# Patient Record
Sex: Male | Born: 1995 | Race: White | Hispanic: No | Marital: Single | Smoking: Never smoker
Health system: Southern US, Community
[De-identification: ages and names within clinical notes are randomized; demographics above are authoritative.]

## PROBLEM LIST (undated history)

## (undated) DIAGNOSIS — S060X9A Concussion with loss of consciousness of unspecified duration, initial encounter: Secondary | ICD-10-CM

## (undated) DIAGNOSIS — S060XAA Concussion with loss of consciousness status unknown, initial encounter: Secondary | ICD-10-CM

## (undated) DIAGNOSIS — F909 Attention-deficit hyperactivity disorder, unspecified type: Secondary | ICD-10-CM

## (undated) HISTORY — DX: Concussion with loss of consciousness of unspecified duration, initial encounter: S06.0X9A

## (undated) HISTORY — DX: Attention-deficit hyperactivity disorder, unspecified type: F90.9

## (undated) HISTORY — PX: WISDOM TOOTH EXTRACTION: SHX21

## (undated) HISTORY — DX: Concussion with loss of consciousness status unknown, initial encounter: S06.0XAA

---
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
## 2014-02-12 ENCOUNTER — Encounter: Payer: Self-pay | Admitting: Internal Medicine

## 2014-02-12 ENCOUNTER — Ambulatory Visit (INDEPENDENT_AMBULATORY_CARE_PROVIDER_SITE_OTHER): Payer: BC Managed Care – PPO | Admitting: Internal Medicine

## 2014-02-12 VITALS — BP 110/70 | HR 59 | Temp 97.6°F | Resp 18 | Ht 70.25 in | Wt 150.0 lb

## 2014-02-12 DIAGNOSIS — L03116 Cellulitis of left lower limb: Secondary | ICD-10-CM

## 2014-02-12 NOTE — Progress Notes (Signed)
Pre visit review using our clinic review tool, if applicable. No additional management support is needed unless otherwise documented below in the visit note. 

## 2014-02-12 NOTE — Patient Instructions (Signed)
Keep the wound of the left foot clean and dry  Take your antibiotic as prescribed until ALL of it is gone, but stop if you develop a rash, swelling, or any side effects of the medication.  Contact our office as soon as possible if  there are side effects of the medication.

## 2014-02-12 NOTE — Progress Notes (Signed)
   Subjective:    Patient ID: Clayton Frank, male    DOB: 1995-05-31, 18 y.o.   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  He developed a secondary infection and has been on topical and oral antibiotics since October 12.  He has improved nicely over the past few days.  Past Medical History  Diagnosis Date  . ADHD (attention deficit hyperactivity disorder)     History   Social History  . Marital Status: Single    Spouse Name: N/A    Number of Children: N/A  . Years of Education: N/A   Occupational History  . Not on file.   Social History Main Topics  . Smoking status: Never Smoker   . Smokeless tobacco: Never Used  . Alcohol Use: No  . Drug Use: No  . Sexual Activity: Not on file   Other Topics Concern  . Not on file   Social History Narrative  . No narrative on file    History reviewed. No pertinent past surgical history.  Family History  Problem Relation Age of Onset  . Coronary artery disease      fhx    No Known Allergies  No current outpatient prescriptions on file prior to visit.   No current facility-administered medications on file prior to visit.    BP 110/70  Pulse 59  Temp(Src) 97.6 F (36.4 C) (Oral)  Resp 18  Ht 5' 10.25" (1.784 m)  Wt 150 lb (68.04 kg)  BMI 21.38 kg/m2  SpO2 98%      Review of Systems     Objective:   Physical Exam  Constitutional: He appears well-developed and well-nourished. No distress.  Skin:  A 6 cm laceration present involving the medial aspect of the left foot Minimal surrounding erythema and redness No exudate          Assessment & Plan:   Improving skin and soft tissue infection/laceration, left medial foot.  The patient will complete doxycycline.  Continue Bactroban ointment Local wound care discussed.  He will keep clean and dry He will report any clinical deterioration

## 2014-11-16 ENCOUNTER — Ambulatory Visit (INDEPENDENT_AMBULATORY_CARE_PROVIDER_SITE_OTHER): Payer: BLUE CROSS/BLUE SHIELD | Admitting: Internal Medicine

## 2014-11-16 ENCOUNTER — Encounter: Payer: Self-pay | Admitting: Internal Medicine

## 2014-11-16 VITALS — BP 126/70 | HR 89 | Temp 98.5°F | Resp 20 | Ht 70.25 in | Wt 154.0 lb

## 2014-11-16 DIAGNOSIS — K5900 Constipation, unspecified: Secondary | ICD-10-CM | POA: Diagnosis not present

## 2014-11-16 DIAGNOSIS — K625 Hemorrhage of anus and rectum: Secondary | ICD-10-CM

## 2014-11-16 NOTE — Patient Instructions (Signed)

## 2014-11-16 NOTE — Progress Notes (Signed)
Pre visit review using our clinic review tool, if applicable. No additional management support is needed unless otherwise documented below in the visit note. 

## 2014-11-16 NOTE — Progress Notes (Signed)
   Subjective:    Patient ID: Clayton Frank, male    DOB: Apr 29, 1996, 19 y.o.   MRN: [id number]  HPI  19 year old patient who presents with a chief complaint of periodic bright red rectal bleeding.  This has been going on for least 8 months.  Blood is described as bright red and on the tissue paper only.  He does also describe constipation. 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 ADHD (attention deficit hyperactivity disorder)     History   Social History  . Marital Status: Single    Spouse Name: N/A  . Number of Children: N/A  . Years of Education: N/A   Occupational History  . Not on file.   Social History Main Topics  . Smoking status: Never Smoker   . Smokeless tobacco: Never Used  . Alcohol Use: No  . Drug Use: No  . Sexual Activity: Not on file   Other Topics Concern  . Not on file   Social History Narrative    No past surgical history on file.  Family History  Problem Relation Age of Onset  . Coronary artery disease      fhx    No Known Allergies  No current outpatient prescriptions on file prior to visit.   No current facility-administered medications on file prior to visit.    BP 126/70 mmHg  Pulse 89  Temp(Src) 98.5 F (36.9 C) (Oral)  Resp 20  Ht 5' 10.25" (1.784 m)  Wt 154 lb (69.854 kg)  BMI 21.95 kg/m2  SpO2 98%     Review of Systems  Constitutional: Negative for fever, chills, appetite change and fatigue.  HENT: Negative for congestion, dental problem, ear pain, hearing loss, sore throat, tinnitus, trouble swallowing and voice change.   Eyes: Negative for pain, discharge and visual disturbance.  Respiratory: Negative for cough, chest tightness, wheezing and stridor.   Cardiovascular: Negative for chest pain, palpitations and leg swelling.  Gastrointestinal: Positive for constipation and blood in stool. Negative for nausea, vomiting, abdominal pain,  diarrhea and abdominal distention.  Genitourinary: Negative for urgency, hematuria, flank pain, discharge, difficulty urinating and genital sores.  Musculoskeletal: Negative for myalgias, back pain, joint swelling, arthralgias, gait problem and neck stiffness.  Skin: Negative for rash.  Neurological: Negative for dizziness, syncope, speech difficulty, weakness, numbness and headaches.  Hematological: Negative for adenopathy. Does not bruise/bleed easily.  Psychiatric/Behavioral: Negative for behavioral problems and dysphoric mood. The patient is not nervous/anxious.        Objective:   Physical Exam  Constitutional: He appears well-developed and well-nourished. He appears distressed.  Abdominal: Soft. Bowel sounds are normal. He exhibits no distension and no mass. There is no tenderness. There is no rebound and no guarding.  Genitourinary: Rectum normal. Guaiac negative stool.          Assessment & Plan:   Periodic bright red rectal bleeding Constipation Normal.  Clinical exam  We'll treat the constipation issues with more fiber, liberal fluid intake and short-term use of stool softeners.  Patient information dispensed  Will call if persists

## 2014-12-09 ENCOUNTER — Emergency Department (HOSPITAL_COMMUNITY)
Admission: EM | Admit: 2014-12-09 | Discharge: 2014-12-10 | Disposition: A | Discharge status: Home / Self Care | Payer: Commercial Managed Care - PPO | Attending: Emergency Medicine | Admitting: Emergency Medicine

## 2014-12-09 ENCOUNTER — Encounter (HOSPITAL_COMMUNITY): Payer: Self-pay

## 2014-12-09 DIAGNOSIS — R61 Generalized hyperhidrosis: Secondary | ICD-10-CM | POA: Diagnosis not present

## 2014-12-09 DIAGNOSIS — Y998 Other external cause status: Secondary | ICD-10-CM | POA: Diagnosis not present

## 2014-12-09 DIAGNOSIS — Z79899 Other long term (current) drug therapy: Secondary | ICD-10-CM | POA: Diagnosis not present

## 2014-12-09 DIAGNOSIS — T63441A Toxic effect of venom of bees, accidental (unintentional), initial encounter: Secondary | ICD-10-CM | POA: Insufficient documentation

## 2014-12-09 DIAGNOSIS — R070 Pain in throat: Secondary | ICD-10-CM | POA: Insufficient documentation

## 2014-12-09 DIAGNOSIS — Y9289 Other specified places as the place of occurrence of the external cause: Secondary | ICD-10-CM | POA: Diagnosis not present

## 2014-12-09 DIAGNOSIS — R55 Syncope and collapse: Secondary | ICD-10-CM

## 2014-12-09 DIAGNOSIS — Z8659 Personal history of other mental and behavioral disorders: Secondary | ICD-10-CM | POA: Insufficient documentation

## 2014-12-09 DIAGNOSIS — Y9389 Activity, other specified: Secondary | ICD-10-CM | POA: Insufficient documentation

## 2014-12-09 DIAGNOSIS — R531 Weakness: Secondary | ICD-10-CM | POA: Diagnosis present

## 2014-12-09 MED ORDER — EPINEPHRINE 0.3 MG/0.3ML IJ SOAJ: 0.3000 mg | Freq: Once | INTRAMUSCULAR | Status: DC

## 2014-12-09 MED ORDER — DIPHENHYDRAMINE HCL 25 MG PO TABS: 50.0000 mg | ORAL_TABLET | Freq: Three times a day (TID) | ORAL | Status: DC | PRN

## 2014-12-09 MED ORDER — SODIUM CHLORIDE 0.9 % IV SOLN
Freq: Once | INTRAVENOUS | Status: AC
  Administered 2014-12-09: 22:00:00 via INTRAVENOUS

## 2014-12-09 NOTE — ED Notes (Signed)
Pt ambulated to restroom. 

## 2014-12-09 NOTE — ED Provider Notes (Signed)
CSN: [id number]     Arrival date & time 12/09/14  2112 History   First MD Initiated Contact with Patient 12/09/14 2113     Chief Complaint  Patient presents with  . Insect Bite   HPI Patient presents to the emergency room for evaluation after a bee sting. The patient was outside when he had a yellow jacket sting to the left bicep area. Patient has no prior history of allergic reaction. He walked inside. He started to feel very weak, diaphoretic and he appeared pale. He also started have some discomfort in his throat. EMS arrived and gave the patient 50 mg of Benadryl. His blood pressure was low in the 60s and he was given epinephrine IM. Patient subsequently is feeling much better. He is not having any difficulty with his breathing. He has not noticed a rash. Past Medical History  Diagnosis Date  . ADHD (attention deficit hyperactivity disorder)    History reviewed. No pertinent past surgical history. Family History  Problem Relation Age of Onset  . Coronary artery disease      fhx   Social History  Substance Use Topics  . Smoking status: Never Smoker   . Smokeless tobacco: Never Used  . Alcohol Use: No    Review of Systems  All other systems reviewed and are negative.     Allergies  Review of patient's allergies indicates no known allergies.  Home Medications   Prior to Admission medications   Medication Sig Start Date End Date Taking? Authorizing Provider  diphenhydrAMINE (BENADRYL) 25 MG tablet Take 2 tablets (50 mg total) by mouth every 8 (eight) hours as needed for itching. 12/09/14   Dorie Rank, MD  EPINEPHrine (EPIPEN 2-PAK) 0.3 mg/0.3 mL IJ SOAJ injection Inject 0.3 mLs (0.3 mg total) into the muscle once. 12/09/14   Dorie Rank, MD  Multiple Vitamin (MULTIVITAMIN) tablet Take 1 tablet by mouth daily.    Historical Provider, MD   BP 123/60 mmHg  Pulse 95  Temp(Src) 97.3 F (36.3 C) (Oral)  Resp 22  Ht 5\' 11"  (1.803 m)  Wt 150 lb (68.04 kg)  BMI 20.93 kg/m2  SpO2  99% Physical Exam  Constitutional: He appears well-developed and well-nourished. No distress.  HENT:  Head: Normocephalic and atraumatic.  Right Ear: External ear normal.  Left Ear: External ear normal.  Eyes: Conjunctivae are normal. Right eye exhibits no discharge. Left eye exhibits no discharge. No scleral icterus.  Neck: Neck supple. No tracheal deviation present.  Cardiovascular: Normal rate, regular rhythm and intact distal pulses.   Pulmonary/Chest: Effort normal and breath sounds normal. No stridor. No respiratory distress. He has no wheezes. He has no rales.  Abdominal: Soft. Bowel sounds are normal. He exhibits no distension. There is no tenderness. There is no rebound and no guarding.  Musculoskeletal: He exhibits no edema or tenderness.  Neurological: He is alert. He has normal strength. No cranial nerve deficit (no facial droop, extraocular movements intact, no slurred speech) or sensory deficit. He exhibits normal muscle tone. He displays no seizure activity. Coordination normal.  Skin: Skin is warm and dry. No rash noted.  Psychiatric: He has a normal mood and affect.  Nursing note and vitals reviewed.   ED Course  Procedures (including critical care time) Labs Review Labs Reviewed - No data to display  Imaging Review No results found.   EKG Interpretation   Date/Time:  Wednesday December 09 2014 21:20:06 EDT Ventricular Rate:  84 PR Interval:  145 QRS Duration: 112  QT Interval:  374 QTC Calculation: 442 R Axis:   88 Text Interpretation:  Sinus rhythm Incomplete right bundle branch block No  significant change since last tracing Confirmed by Mosie Angus  MD-J, Shayma Pfefferle  ([id number]) on 12/09/2014 9:46:27 PM      MDM   Final diagnoses:  Bee sting, accidental or unintentional, initial encounter  Vasovagal episode    Patient was monitored in the emergency room. He did not have any return of any of his symptoms. Overall, I think he had a vasovagal episode after the bee sting  rather than a true anaphylactic reaction. Patient did not have any urticaria, he never had any itching. He did respond to epinephrine injection but this would also help him with a vasovagal reaction.  I will give the patient a prescription for an EpiPen to carry with him however until he can see an allergist to have allergy testing to determine whether or not he has a hymenoptera allergy.  Discussed this plan with patient as well as his parents.    Dorie Rank, MD 12/09/14 [phone]

## 2014-12-09 NOTE — Discharge Instructions (Signed)

## 2014-12-09 NOTE — ED Notes (Signed)
Pt here for yellow jacket sting to left bicep. No hx of allergic rxn to bee stings. No hives or rash noted. Pt had episode of SOB, weakness, diaphoresis, and bp dropped to 60 systolic. Family gave pt 50 mg benadryl and medic on scene gave epi IM. On arrival to ED. NAD noted. Pt calm. Oriented

## 2016-04-18 ENCOUNTER — Encounter: Payer: Self-pay | Admitting: Allergy and Immunology

## 2016-04-18 ENCOUNTER — Ambulatory Visit (INDEPENDENT_AMBULATORY_CARE_PROVIDER_SITE_OTHER): Payer: Commercial Managed Care - PPO | Admitting: Allergy and Immunology

## 2016-04-18 ENCOUNTER — Encounter (INDEPENDENT_AMBULATORY_CARE_PROVIDER_SITE_OTHER): Payer: Self-pay

## 2016-04-18 VITALS — BP 116/68 | HR 88 | Resp 20 | Ht 70.0 in | Wt 164.6 lb

## 2016-04-18 DIAGNOSIS — J3089 Other allergic rhinitis: Secondary | ICD-10-CM

## 2016-04-18 DIAGNOSIS — Z91038 Other insect allergy status: Secondary | ICD-10-CM | POA: Diagnosis not present

## 2016-04-18 DIAGNOSIS — Z91030 Bee allergy status: Secondary | ICD-10-CM

## 2016-04-18 MED ORDER — EPINEPHRINE 0.3 MG/0.3ML IJ SOAJ: 0.3000 mg | Freq: Once | INTRAMUSCULAR | 1 refills | Status: AC

## 2016-04-18 NOTE — Patient Instructions (Addendum)
  1. Auvi Q 3.0, Benadryl, MD/ER evaluation for allergic reaction  2. Blood - hymenoptera panel  3. Venom clinic?  4. Immunotherapy?  5. Can use OTC Rhinocort one spray each nostril 3-7 times per week. Takes about one week to start working. Can also use OTC antihistamine - Claritin/Allegra/Zyrtec if needed  6. Obtain fall flu vaccine every year

## 2016-04-18 NOTE — Progress Notes (Signed)
Dear Dr. Burnice Logan,  Thank you for referring Clayton Frank to the [facility] on 04/18/2016.   Below is a summation of this patient's evaluation and recommendations.  Thank you for your referral. I will keep you informed about this patient's response to treatment.   If you have any questions please do not hesitate to contact me.   Sincerely,  Jiles Prows, MD [place]   ______________________________________________________________________    NEW PATIENT NOTE  Referring Provider: Marletta Lor, MD Primary Provider: Nyoka Cowden, MD Date of office visit: 04/18/2016    Subjective:   Chief Complaint:  Clayton Frank (DOB: 08-15-1995) is a 20 y.o. male who presents to the clinic on 04/18/2016 with a chief complaint of New Patient (Initial Visit); Allergy Testing; and Insect Bite .     HPI: [name] presents to this clinic in evaluation of an allergic reaction that occurred summer of 2016. Apparently he was stung by a yellow jacket on his left bicep and within 15 minutes started to develop redness around his neck and puffiness in his throat and foggy brain and weakness and blurred vision and sweating and had to sit down and almost passed out. In addition, he felt as though he had to have a bowel movement. Apparently EMT service documented a depressed blood pressure around 60 systolic or so and he was administered epinephrine. He was better within minutes and his reaction resolved within one hour. He has not been stung since that point in time.  As well, he has an issue with stuffy nose especially during the spring and summer season while locating at [employer]. Otherwise, the rest of the year he does quite well. He sometimes gets so stuffy he can't smell very well. There is no obvious provoking factor giving rise to this issue but it does appear to occur on  campus during the spring and summer and nowhere else.  Past Medical History:  Diagnosis Date  . ADHD (attention deficit hyperactivity disorder)   . Concussion     History reviewed. No pertinent surgical history.  Allergies as of 04/18/2016   No Known Allergies     Medication List      diphenhydrAMINE 25 MG tablet Commonly known as:  BENADRYL Take 2 tablets (50 mg total) by mouth every 8 (eight) hours as needed for itching.   EPINEPHrine 0.3 mg/0.3 mL Soaj injection Commonly known as:  EPIPEN 2-PAK Inject 0.3 mLs (0.3 mg total) into the muscle once.   multivitamin tablet Take 1 tablet by mouth daily.       Review of systems negative except as noted in HPI / PMHx or noted below:  Review of Systems  Constitutional: Negative.   HENT: Negative.   Eyes: Negative.   Respiratory: Negative.   Cardiovascular: Negative.   Gastrointestinal: Negative.   Genitourinary: Negative.   Musculoskeletal: Negative.   Skin: Negative.   Neurological: Negative.   Endo/Heme/Allergies: Negative.   Psychiatric/Behavioral: Negative.     Family History  Problem Relation Age of Onset  . Coronary artery disease      fhx  . Allergic rhinitis Father   . Hypertension Father   . Hyperlipidemia Father     Social History   Social History  . Marital status: Single    Spouse name: N/A  . Number of children: N/A  . Years of education: N/A   Occupational History  .  Not on file.   Social History Main Topics  . Smoking status: Never Smoker  . Smokeless tobacco: Never Used  . Alcohol use No  . Drug use: No  . Sexual activity: Not on file   Other Topics Concern  . Not on file   Social History Narrative  . No narrative on file    Environmental and Social history  Lives in a house with a dry environment, a cat and dog located inside the household, hardwood in the bedroom, no plastic on the bed or pillow, and no smokers located inside the household. He is a Ship broker at [employer].  Objective:   Vitals:   04/18/16 0939  BP: 116/68  Pulse: 88  Resp: 20   Height: 5\' 10"  (177.8 cm) Weight: 164 lb 9.6 oz (74.7 kg)  Physical Exam  Constitutional: He is well-developed, well-nourished, and in no distress.  HENT:  Head: Normocephalic. Head is without right periorbital erythema and without left periorbital erythema.  Right Ear: Tympanic membrane, external ear and ear canal normal.  Left Ear: Tympanic membrane, external ear and ear canal normal.  Nose: Nose normal. No mucosal edema or rhinorrhea.  Mouth/Throat: Oropharynx is clear and moist and mucous membranes are normal. No oropharyngeal exudate.  Eyes: Conjunctivae and lids are normal. Pupils are equal, round, and reactive to light.  Neck: Trachea normal. No tracheal deviation present. No thyromegaly present.  Cardiovascular: Normal rate, regular rhythm, S1 normal, S2 normal and normal heart sounds.   No murmur heard. Pulmonary/Chest: Effort normal. No stridor. No tachypnea. No respiratory distress. He has no wheezes. He has no rales. He exhibits no tenderness.  Abdominal: Soft. He exhibits no distension and no mass. There is no hepatosplenomegaly. There is no tenderness. There is no rebound and no guarding.  Musculoskeletal: He exhibits no edema or tenderness.  Lymphadenopathy:       Head (right side): No tonsillar adenopathy present.       Head (left side): No tonsillar adenopathy present.    He has no cervical adenopathy.    He has no axillary adenopathy.  Neurological: He is alert. Gait normal.  Skin: No rash noted. He is not diaphoretic. No erythema. No pallor. Nails show no clubbing.  Psychiatric: Mood and affect normal.    Diagnostics: Allergy skin tests were not performed.   Assessment and Plan:    1. Hymenoptera allergy   2. Other allergic rhinitis     1. Auvi Q 3.0, Benadryl, MD/ER evaluation for allergic reaction  2. Blood - hymenoptera panel  3. Venom clinic?  4.  Immunotherapy?  5. Can use OTC Rhinocort one spray each nostril 3-7 times per week. Takes about one week to start working. Can also use OTC antihistamine - Claritin/Allegra/Zyrtec if needed  6. Obtain fall flu vaccine every year  [name] has a history very consistent with Hymenoptera allergy and we will work through this issue in a little more detail by checking IgE specific antibodies directed against Hymenoptera and if we do not get enough information from that test we'll evaluate him in our upcoming venom clinic. If he does demonstrate hypersensitivity to venom then I would recommend that he start a course of immunotherapy given the fact that it does sound as though he developed hypotension with his reaction. In addition, he can use over-the-counter Rhinocort to treat his apparent aero allergen allergy affecting his upper airways during the spring and summer. Certainly if this is ineffective in alleviating his symptoms then he  will require further evaluation for that issue. I will contact him with the results of his blood tests once they are available for review.  Jiles Prows, MD [facility]

## 2016-04-24 LAB — ALLERGEN STINGING INSECT PANEL
Honeybee IgE: <0.1 kU/L
Hornet, White Face, IgE: 0.17 kU/L — AB
Hornet, Yellow, IgE: <0.1 kU/L
I003-IGE YELLOW JACKET: 0.73 kU/L — AB
Paper Wasp IgE: <0.1 kU/L

## 2016-04-28 ENCOUNTER — Telehealth: Payer: Self-pay

## 2016-04-28 NOTE — Telephone Encounter (Signed)
Pt called regarding lab results. He was advised about IT for mixed vespids. Pt states that he will think about getting the Injections. He did not want provide an answer right away.

## 2016-07-24 DIAGNOSIS — D485 Neoplasm of uncertain behavior of skin: Secondary | ICD-10-CM | POA: Diagnosis not present

## 2016-08-09 DIAGNOSIS — D225 Melanocytic nevi of trunk: Secondary | ICD-10-CM | POA: Diagnosis not present

## 2016-08-09 DIAGNOSIS — D485 Neoplasm of uncertain behavior of skin: Secondary | ICD-10-CM | POA: Diagnosis not present

## 2017-01-12 ENCOUNTER — Ambulatory Visit (INDEPENDENT_AMBULATORY_CARE_PROVIDER_SITE_OTHER): Payer: Commercial Managed Care - PPO | Admitting: Internal Medicine

## 2017-01-12 ENCOUNTER — Encounter: Payer: Self-pay | Admitting: Internal Medicine

## 2017-01-12 VITALS — BP 126/72 | HR 77 | Temp 98.1°F | Ht 70.5 in | Wt 181.6 lb

## 2017-01-12 DIAGNOSIS — Z0000 Encounter for general adult medical examination without abnormal findings: Secondary | ICD-10-CM

## 2017-01-12 NOTE — Patient Instructions (Signed)
Please return as needed for any acute problems

## 2017-01-12 NOTE — Progress Notes (Signed)
Subjective:    Patient ID: Clayton Frank, male    DOB: 09-28-1995, 21 y.o.   MRN: [id number]  HPI  21 year old patient who is seen today for form completion.  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  No history of syncope.  No orthopedic issues or concerns or complaints.  Past Medical History:  Diagnosis Date  . ADHD (attention deficit hyperactivity disorder)   . Concussion      Social History   Social History  . Marital status: Single    Spouse name: N/A  . Number of children: N/A  . Years of education: N/A   Occupational History  . Not on file.   Social History Main Topics  . Smoking status: Never Smoker  . Smokeless tobacco: Never Used  . Alcohol use No  . Drug use: No  . Sexual activity: Not on file   Other Topics Concern  . Not on file   Social History Narrative  . No narrative on file    No past surgical history on file.  Family History  Problem Relation Age of Onset  . Coronary artery disease Unknown        fhx  . Allergic rhinitis Father   . Hypertension Father   . Hyperlipidemia Father     No Known Allergies  No current outpatient prescriptions on file prior to visit.   No current facility-administered medications on file prior to visit.     BP 126/72 (BP Location: Left Arm, Patient Position: Sitting, Cuff Size: Normal)   Pulse 77   Temp 98.1 F (36.7 C) (Oral)   Ht 5' 10.5" (1.791 m)   Wt 181 lb 9.6 oz (82.4 kg)   SpO2 98%   BMI 25.69 kg/m     Review of Systems  Constitutional: Negative for appetite change, chills, fatigue and fever.  HENT: Negative for congestion, dental problem, ear pain, hearing loss, sore throat, tinnitus, trouble swallowing and voice change.   Eyes: Negative for pain, discharge and visual disturbance.  Respiratory: Negative for cough, chest tightness, wheezing and stridor.     Cardiovascular: Negative for chest pain, palpitations and leg swelling.  Gastrointestinal: Negative for abdominal distention, abdominal pain, blood in stool, constipation, diarrhea, nausea and vomiting.  Genitourinary: Negative for difficulty urinating, discharge, flank pain, genital sores, hematuria and urgency.  Musculoskeletal: Negative for arthralgias, back pain, gait problem, joint swelling, myalgias and neck stiffness.  Skin: Negative for rash.  Neurological: Negative for dizziness, syncope, speech difficulty, weakness, numbness and headaches.  Hematological: Negative for adenopathy. Does not bruise/bleed easily.  Psychiatric/Behavioral: Negative for behavioral problems and dysphoric mood. The patient is not nervous/anxious.        Objective:   Physical Exam  Constitutional: He is oriented to person, place, and time. He appears well-developed.  HENT:  Head: Normocephalic.  Right Ear: External ear normal.  Left Ear: External ear normal.  Eyes: Conjunctivae and EOM are normal.  Neck: Normal range of motion.  Cardiovascular: Normal rate and normal heart sounds.   No murmur heard. No murmur  Pulmonary/Chest: Breath sounds normal.  Abdominal: Bowel sounds are normal.  Musculoskeletal: Normal range of motion. He exhibits no edema or tenderness.  Neurological: He is alert and oriented to person, place, and time.  Psychiatric: He has a normal mood and affect. His behavior is normal.  Assessment & Plan:   Unremarkable exam No limitations Forms completed.  Excellent candidate for the police academy  Nyoka Cowden

## 2017-01-28 DIAGNOSIS — S90861A Insect bite (nonvenomous), right foot, initial encounter: Secondary | ICD-10-CM | POA: Diagnosis not present

## 2017-01-28 DIAGNOSIS — Z880 Allergy status to penicillin: Secondary | ICD-10-CM | POA: Diagnosis not present

## 2017-01-28 DIAGNOSIS — T63441A Toxic effect of venom of bees, accidental (unintentional), initial encounter: Secondary | ICD-10-CM | POA: Diagnosis not present

## 2017-02-06 ENCOUNTER — Telehealth: Payer: Self-pay | Admitting: *Deleted

## 2017-02-06 NOTE — Telephone Encounter (Signed)
Done and ready for pick up

## 2017-02-06 NOTE — Telephone Encounter (Signed)
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 Form is in Dr Burnice Logan folder.

## 2017-02-06 NOTE — Telephone Encounter (Signed)
Please note and advise

## 2017-02-07 NOTE — Telephone Encounter (Signed)
Patient picked up form dropped off on 02/06/17

## 2017-02-07 NOTE — Telephone Encounter (Signed)
Left a voicemail stating that paperwork is ready to be picked up.

## 2017-02-20 ENCOUNTER — Ambulatory Visit
Admission: RE | Admit: 2017-02-20 | Discharge: 2017-02-20 | Disposition: A | Discharge status: Home / Self Care | Payer: No Typology Code available for payment source | Source: Ambulatory Visit | Attending: Occupational Medicine | Admitting: Occupational Medicine

## 2017-02-20 ENCOUNTER — Other Ambulatory Visit: Payer: Self-pay | Admitting: Occupational Medicine

## 2017-02-20 DIAGNOSIS — Z021 Encounter for pre-employment examination: Secondary | ICD-10-CM

## 2017-06-05 ENCOUNTER — Ambulatory Visit: Payer: Commercial Managed Care - PPO | Admitting: Adult Health

## 2017-06-05 ENCOUNTER — Encounter: Payer: Self-pay | Admitting: Adult Health

## 2017-06-05 VITALS — BP 106/60 | Temp 98.3°F | Wt 178.0 lb

## 2017-06-05 DIAGNOSIS — J069 Acute upper respiratory infection, unspecified: Secondary | ICD-10-CM | POA: Diagnosis not present

## 2017-06-05 MED ORDER — DOXYCYCLINE HYCLATE 100 MG PO CAPS: 100.0000 mg | ORAL_CAPSULE | Freq: Two times a day (BID) | ORAL | 0 refills | Status: DC

## 2017-06-05 MED ORDER — FLUTICASONE PROPIONATE 50 MCG/ACT NA SUSP: 2.0000 | Freq: Every day | NASAL | 6 refills | Status: DC

## 2017-06-05 NOTE — Progress Notes (Signed)
Subjective:    Patient ID: Clayton Frank, male    DOB: July 16, 1995, 22 y.o.   MRN: [id number]  HPI  3 week history of nasal and chest congestion.  Also has a productive cough with green phlegm.  He denies fever, chest tightness.  Does have some shortness of breath when he is running (3 miles a day).  Using OTC robitussin with some relief for the past 3-4 days.       Review of Systems  Constitutional: Positive for chills and fatigue. Negative for appetite change, diaphoresis and fever.  HENT: Positive for congestion, sinus pressure and sinus pain. Negative for ear pain and sore throat.   Respiratory: Positive for cough, shortness of breath and wheezing. Negative for chest tightness.   Gastrointestinal: Negative.   Neurological: Positive for headaches.   Past Medical History:  Diagnosis Date  . ADHD (attention deficit hyperactivity disorder)   . Concussion     Social History   Socioeconomic History  . Marital status: Single    Spouse name: Not on file  . Number of children: Not on file  . Years of education: Not on file  . Highest education level: Not on file  Social Needs  . Financial resource strain: Not on file  . Food insecurity - worry: Not on file  . Food insecurity - inability: Not on file  . Transportation needs - medical: Not on file  . Transportation needs - non-medical: Not on file  Occupational History  . Not on file  Tobacco Use  . Smoking status: Never Smoker  . Smokeless tobacco: Never Used  Substance and Sexual Activity  . Alcohol use: No  . Drug use: No  . Sexual activity: Not on file  Other Topics Concern  . Not on file  Social History Narrative  . Not on file    History reviewed. No pertinent surgical history.  Family History  Problem Relation Age of Onset  . Coronary artery disease Unknown        fhx  . Allergic rhinitis Father   . Hypertension Father   . Hyperlipidemia Father     No Known Allergies  No current outpatient  medications on file prior to visit.   No current facility-administered medications on file prior to visit.     BP 106/60 (BP Location: Left Arm)   Temp 98.3 F (36.8 C) (Oral)   Wt 178 lb (80.7 kg)   BMI 25.18 kg/m       Objective:   Physical Exam  Constitutional: He appears well-developed and well-nourished.  HENT:  Head: Normocephalic.  Right Ear: Tympanic membrane normal.  Left Ear: Tympanic membrane normal.  Nose: Mucosal edema present. Right sinus exhibits no maxillary sinus tenderness and no frontal sinus tenderness. Left sinus exhibits no maxillary sinus tenderness and no frontal sinus tenderness.  Mouth/Throat: Uvula is midline. No uvula swelling. Posterior oropharyngeal edema and posterior oropharyngeal erythema present.  Redness bilateral inferior turbinates left greater than right.   Cardiovascular: Normal rate, regular rhythm and normal heart sounds.  Pulmonary/Chest: Effort normal and breath sounds normal.  Lymphadenopathy:    He has no cervical adenopathy.  Skin: Skin is warm and dry.       Assessment & Plan:  1. Upper respiratory infection, acute Continue OTC Robitussin for cough.  - doxycycline (VIBRAMYCIN) 100 MG capsule; Take 1 capsule (100 mg total) by mouth 2 (two) times daily.  Dispense: 14 capsule; Refill: 0 - fluticasone (FLONASE) 50 MCG/ACT nasal spray; Place  2 sprays into both nostrils daily.  Dispense: 16 g; Refill: 6

## 2017-06-05 NOTE — Progress Notes (Signed)
Subjective:    Patient ID: Clayton Frank, male    DOB: 03-30-1996, 22 y.o.   MRN: [id number]  URI   This is a new problem. The current episode started 1 to 4 weeks ago (3 weeks ). The problem has been unchanged. There has been no fever. Associated symptoms include congestion, coughing, sinus pain and a sore throat. Pertinent negatives include no ear pain, nausea, plugged ear sensation, rhinorrhea or swollen glands. Treatments tried: Robatussin  The treatment provided no relief.     Review of Systems  Constitutional: Negative.   HENT: Positive for congestion, sinus pain and sore throat. Negative for ear pain, postnasal drip, rhinorrhea and trouble swallowing.   Respiratory: Positive for cough and shortness of breath (with exertion ).   Cardiovascular: Negative.   Gastrointestinal: Negative for nausea.   Past Medical History:  Diagnosis Date  . ADHD (attention deficit hyperactivity disorder)   . Concussion     Social History   Socioeconomic History  . Marital status: Single    Spouse name: Not on file  . Number of children: Not on file  . Years of education: Not on file  . Highest education level: Not on file  Social Needs  . Financial resource strain: Not on file  . Food insecurity - worry: Not on file  . Food insecurity - inability: Not on file  . Transportation needs - medical: Not on file  . Transportation needs - non-medical: Not on file  Occupational History  . Not on file  Tobacco Use  . Smoking status: Never Smoker  . Smokeless tobacco: Never Used  Substance and Sexual Activity  . Alcohol use: No  . Drug use: No  . Sexual activity: Not on file  Other Topics Concern  . Not on file  Social History Narrative  . Not on file    History reviewed. No pertinent surgical history.  Family History  Problem Relation Age of Onset  . Coronary artery disease Unknown        fhx  . Allergic rhinitis Father   . Hypertension Father   . Hyperlipidemia Father      No Known Allergies  No current outpatient medications on file prior to visit.   No current facility-administered medications on file prior to visit.     BP 106/60 (BP Location: Left Arm)   Temp 98.3 F (36.8 C) (Oral)   Wt 178 lb (80.7 kg)   BMI 25.18 kg/m       Objective:   Physical Exam  Constitutional: He is oriented to person, place, and time. He appears well-developed and well-nourished. No distress.  HENT:  Head: Normocephalic and atraumatic.  Right Ear: External ear normal.  Left Ear: External ear normal.  Nose: Mucosal edema and rhinorrhea present. Right sinus exhibits maxillary sinus tenderness and frontal sinus tenderness. Left sinus exhibits maxillary sinus tenderness and frontal sinus tenderness.  Mouth/Throat: Uvula is midline, oropharynx is clear and moist and mucous membranes are normal.  Neck: Normal range of motion. Neck supple.  Cardiovascular: Normal rate, regular rhythm, normal heart sounds and intact distal pulses. Exam reveals no gallop and no friction rub.  No murmur heard. Pulmonary/Chest: Effort normal and breath sounds normal. No respiratory distress. He has no wheezes. He has no rales. He exhibits no tenderness.  Neurological: He is alert and oriented to person, place, and time.  Skin: Skin is warm and dry. No rash noted. He is not diaphoretic. No erythema. No pallor.  Psychiatric: He  has a normal mood and affect. His behavior is normal. Judgment and thought content normal.  Nursing note and vitals reviewed.     Assessment & Plan:  1. Upper respiratory infection, acute - Will treat due to symptoms. Advised rest and hydration.  - Can continue with Robitussin for cough  - doxycycline (VIBRAMYCIN) 100 MG capsule; Take 1 capsule (100 mg total) by mouth 2 (two) times daily.  Dispense: 14 capsule; Refill: 0 - fluticasone (FLONASE) 50 MCG/ACT nasal spray; Place 2 sprays into both nostrils daily.  Dispense: 16 g; Refill: 6 - Follow up if no  improvement in the next 2-3 days   Dorothyann Peng, NP

## 2017-12-28 ENCOUNTER — Encounter: Payer: Self-pay | Admitting: Family Medicine

## 2017-12-28 ENCOUNTER — Ambulatory Visit: Payer: 59 | Admitting: Family Medicine

## 2017-12-28 DIAGNOSIS — Z0289 Encounter for other administrative examinations: Secondary | ICD-10-CM

## 2017-12-28 NOTE — Progress Notes (Deleted)
  Clayton Frank [facility] DOB: 29-Mar-1996 Encounter date: 12/28/2017  This is a 22 y.o. male who presents with No chief complaint on file.   History of present illness:  HPI   No Known Allergies No outpatient medications have been marked as taking for the 12/28/17 encounter (Appointment) with Caren Macadam, MD.    Review of Systems  Objective:  There were no vitals taken for this visit.      BP Readings from Last 3 Encounters:  06/05/17 106/60  01/12/17 126/72  04/18/16 116/68   Wt Readings from Last 3 Encounters:  06/05/17 178 lb (80.7 kg)  01/12/17 181 lb 9.6 oz (82.4 kg)  04/18/16 164 lb 9.6 oz (74.7 kg)    Physical Exam  Assessment/Plan  There are no diagnoses linked to this encounter.       Micheline Rough, MD

## 2018-01-07 ENCOUNTER — Encounter: Payer: Self-pay | Admitting: Internal Medicine

## 2018-01-07 ENCOUNTER — Ambulatory Visit: Payer: 59 | Admitting: Internal Medicine

## 2018-01-07 VITALS — BP 100/60 | HR 98 | Temp 98.0°F | Wt 160.8 lb

## 2018-01-07 DIAGNOSIS — R1011 Right upper quadrant pain: Secondary | ICD-10-CM

## 2018-01-07 MED ORDER — EPINEPHRINE 0.3 MG/0.3ML IJ SOAJ: 0.3000 mg | Freq: Once | INTRAMUSCULAR | 2 refills | Status: AC

## 2018-01-07 MED ORDER — PANTOPRAZOLE SODIUM 40 MG PO TBEC: 40.0000 mg | DELAYED_RELEASE_TABLET | Freq: Every day | ORAL | 3 refills | Status: DC

## 2018-01-07 NOTE — Progress Notes (Signed)
Subjective:    Patient ID: Clayton Frank, male    DOB: [birth year], 22 y.o.   MRN: [id number]  HPI  22 year old patient who presents with a chief complaint of abdominal pain.  He describes a 8 to 2-month history of right upper quadrant discomfort.  He describes this as a dull ache.  More recently it has also involve the epigastric area.  He states the pain is fairly mild 1-2/10 initially but now is occasionally 3-4/10.  No associated nausea. He states pain is aggravated by alcohol use.  He also states that occasionally discomfort is postprandial and occurs 10 to 15 minutes following meals.  He states the pain is bothersome daily to every other day and last 30 to 60 minutes No family history of cholelithiasis  Social history he has recently graduated from the [employer] academy and has recently started employment as a Engineer, structural  Past Medical History:  Diagnosis Date  . ADHD (attention deficit hyperactivity disorder)   . Concussion      Social History   Socioeconomic History  . Marital status: Single    Spouse name: Not on file  . Number of children: Not on file  . Years of education: Not on file  . Highest education level: Not on file  Occupational History  . Not on file  Social Needs  . Financial resource strain: Not on file  . Food insecurity:    Worry: Not on file    Inability: Not on file  . Transportation needs:    Medical: Not on file    Non-medical: Not on file  Tobacco Use  . Smoking status: Never Smoker  . Smokeless tobacco: Never Used  Substance and Sexual Activity  . Alcohol use: No  . Drug use: No  . Sexual activity: Not on file  Lifestyle  . Physical activity:    Days per week: Not on file    Minutes per session: Not on file  . Stress: Not on file  Relationships  . Social connections:    Talks on phone: Not on file    Gets together: Not on file    Attends religious service: Not on file    Active member of club or organization: Not on file   Attends meetings of clubs or organizations: Not on file    Relationship status: Not on file  . Intimate partner violence:    Fear of current or ex partner: Not on file    Emotionally abused: Not on file    Physically abused: Not on file    Forced sexual activity: Not on file  Other Topics Concern  . Not on file  Social History Narrative  . Not on file    History reviewed. No pertinent surgical history.  Family History  Problem Relation Age of Onset  . Coronary artery disease Unknown        fhx  . Allergic rhinitis Father   . Hypertension Father   . Hyperlipidemia Father     No Known Allergies  No current outpatient medications on file prior to visit.   No current facility-administered medications on file prior to visit.     BP 100/60 (BP Location: Right Arm, Patient Position: Sitting, Cuff Size: Normal)   Pulse 98   Temp 98 F (36.7 C) (Oral)   Wt 160 lb 12.8 oz (72.9 kg)   SpO2 96%   BMI 22.75 kg/m     Review of Systems  Constitutional: Negative for appetite change, chills,  fatigue and fever.  HENT: Negative for congestion, dental problem, ear pain, hearing loss, sore throat, tinnitus, trouble swallowing and voice change.   Eyes: Negative for pain, discharge and visual disturbance.  Respiratory: Negative for cough, chest tightness, wheezing and stridor.   Cardiovascular: Negative for chest pain, palpitations and leg swelling.  Gastrointestinal: Positive for abdominal pain. Negative for abdominal distention, blood in stool, constipation, diarrhea, nausea and vomiting.  Genitourinary: Negative for difficulty urinating, discharge, flank pain, genital sores, hematuria and urgency.  Musculoskeletal: Negative for arthralgias, back pain, gait problem, joint swelling, myalgias and neck stiffness.  Skin: Negative for rash.  Neurological: Negative for dizziness, syncope, speech difficulty, weakness, numbness and headaches.  Hematological: Negative for adenopathy. Does not  bruise/bleed easily.  Psychiatric/Behavioral: Negative for behavioral problems and dysphoric mood. The patient is not nervous/anxious.        Objective:   Physical Exam  Constitutional: He is oriented to person, place, and time. He appears well-developed. No distress.  HENT:  Head: Normocephalic.  Right Ear: External ear normal.  Left Ear: External ear normal.  Eyes: Conjunctivae and EOM are normal.  Neck: Normal range of motion.  Cardiovascular: Normal rate and normal heart sounds.  Pulmonary/Chest: Breath sounds normal.  Abdominal: Soft. Bowel sounds are normal. He exhibits no distension and no mass. There is no tenderness. There is no guarding.  Musculoskeletal: Normal range of motion. He exhibits no edema or tenderness.  Neurological: He is alert and oriented to person, place, and time.  Psychiatric: He has a normal mood and affect. His behavior is normal.          Assessment & Plan:   Chronic abdominal pain.  Clinical exam unremarkable.  Will check gallbladder ultrasound.  Will empirically treat with 3 to 4 weeks of PPI therapy.  Will call if unimproved  Marletta Lor

## 2018-01-07 NOTE — Patient Instructions (Signed)
Avoids foods high in acid such as tomatoes citrus juices, and spicy foods.  Avoid eating within two hours of lying down or before exercising.  Do not overheat.  Try smaller more frequent meals.   Protonix 1 tablet daily for 30 days  Gallbladder ultrasound  Call or return to clinic prn if these symptoms worsen or fail to improve as anticipated.

## 2018-01-21 ENCOUNTER — Ambulatory Visit
Admission: RE | Admit: 2018-01-21 | Discharge: 2018-01-21 | Disposition: A | Discharge status: Home / Self Care | Payer: 59 | Source: Ambulatory Visit | Attending: Internal Medicine | Admitting: Internal Medicine

## 2018-01-21 DIAGNOSIS — R1011 Right upper quadrant pain: Secondary | ICD-10-CM

## 2018-01-31 ENCOUNTER — Telehealth: Payer: Self-pay | Admitting: Internal Medicine

## 2018-01-31 NOTE — Telephone Encounter (Signed)
Copied from [place] [id number]. Topic: Quick Communication - Other Results >> Jan 31, 2018 12:34 PM Cecelia Byars, NT wrote: [facility] AMB CRM OTHER [id number] Patient called and would like a return call with the results on his ultrasound on 01/21/18 please call him at   [phone]

## 2018-01-31 NOTE — Telephone Encounter (Signed)
Copied from [place] [id number]. Topic: Quick Communication - Other Results >> Jan 31, 2018 12:34 PM Cecelia Byars, NT wrote: Patient called and would like a return call with the results on his ultrasound on 01/21/18 please call him at   [phone]

## 2018-02-01 NOTE — Telephone Encounter (Signed)
Spoke to pt and informed him that there were no findings to calm some worries. Pt was also advise that  I would get a provider to go over ultrasound to verify. Pt will be on standby for results.

## 2018-02-04 NOTE — Telephone Encounter (Signed)
Can you please result pt ultrasound and advise  Thank you!

## 2018-02-04 NOTE — Telephone Encounter (Signed)
I reviewed the abdominal ultrasound.  The radiologist says everything is normal.  If he is still symptomatic he will need to see 1 of the new doctors for evaluation

## 2018-02-04 NOTE — Telephone Encounter (Signed)
Message routed to Dr.Todd.

## 2018-02-05 NOTE — Telephone Encounter (Signed)
Pt still having abdominal pain he stated around his appendix. Pt wants to be seen by a provider here. Pt scheduled to see Dr.Koberlien.

## 2018-02-08 NOTE — Telephone Encounter (Signed)
I was looking over schedule next week and saw Clayton Frank on our schedule for pain around appendix. When he saw Dr. Sherren Mocha pain was RUQ and not localized to RLQ where appendix would be. His [place] only looked at gallbladder area and not appendix. If his pain has changed and it is in lower abdomen then he needs to be seen today for evaluation.

## 2018-02-08 NOTE — Telephone Encounter (Signed)
Copied from [place] [id number]. Topic: Quick Communication - Office Called Patient (Clinic Use ONLY) >> Feb 08, 2018 10:30 AM Keene Breath wrote: Patient is returning call.  Patient stated that his pain has not moved and that he already has an appointment on  17th that he will keep and does not need to see the doctor sooner.  Please advised.

## 2018-02-08 NOTE — Telephone Encounter (Signed)
Left a detailed message on cell phone voice mail asking patient to call back and schedule an appointment today. CRM created.

## 2018-02-11 NOTE — Telephone Encounter (Signed)
Ok noted; just worried when I saw pain around appendix since that was different than other visits. Thanks for checking!

## 2018-02-13 NOTE — Progress Notes (Signed)
Clayton Frank [facility] DOB: May 14, 1995 Encounter date: 02/14/2018  This is a 22 y.o. male who presents with Chief Complaint  Patient presents with  . Abdominal Pain    seen Dr. Raliegh Ip for abdiminal pain on right side, pt states is job requires a lot of movement, pain is slightly worse, pain is now almost all the way across the lower abdomin, 4/10 pain scale, does not use OTC meds, protonix did not help    History of present illness:  Hasn't bothered him too much, but hasn't gone away either. Been present for 9-10 months now.   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   Tried acid reflux medication which didn't help; tried it for a month.   Patient states pain is in right lower quadrant and has been there this whole time.   If he eats a lot of food will feel it worse. Dull pain, maybe 3/10. If he drinks alcohol then he feels it much quicker. Does sometimes go away entirely.   Not sure about association with bowel movements. Not related to urine. No straining with BM; usually BID. No blood in the stools.   If working it feels worse. Just with being busy and continuous movement. Feels a little sharper at work. At worst was 5/10.   No nausea, no vomiting.     Allergies  Allergen Reactions  . Bee Venom Anaphylaxis   No outpatient medications have been marked as taking for the 02/14/18 encounter (Office Visit) with Caren Macadam, MD.    Review of Systems  Constitutional: Negative for activity change, appetite change, chills, fatigue and fever. Unexpected weight change: has been trying to gain weight. When doing running program lost weight but trying to build back up.  Respiratory: Negative for cough, chest tightness, shortness of breath and wheezing.   Cardiovascular: Negative for chest pain, palpitations and leg swelling.  Gastrointestinal: Positive for abdominal pain. Negative for abdominal distention, anal bleeding, blood in stool, constipation,  diarrhea, nausea and vomiting.  Genitourinary: Negative for difficulty urinating, dysuria, flank pain and frequency.  Musculoskeletal: Negative for back pain.    Objective:  BP 118/60 (BP Location: Left Arm, Patient Position: Sitting, Cuff Size: Normal)   Pulse (!) 52   Temp 98.1 F (36.7 C) (Oral)   Wt 169 lb (76.7 kg)   SpO2 100%   BMI 23.91 kg/m   Weight: 169 lb (76.7 kg)   BP Readings from Last 3 Encounters:  02/14/18 118/60  01/07/18 100/60  06/05/17 106/60   Wt Readings from Last 3 Encounters:  02/14/18 169 lb (76.7 kg)  01/07/18 160 lb 12.8 oz (72.9 kg)  06/05/17 178 lb (80.7 kg)    Physical Exam  Constitutional: He is oriented to person, place, and time. He appears well-developed and well-nourished. No distress.  Cardiovascular: Normal rate, regular rhythm and normal heart sounds. Exam reveals no friction rub.  No murmur heard. No lower extremity edema  Pulmonary/Chest: Effort normal and breath sounds normal. No respiratory distress. He has no wheezes. He has no rales.  Abdominal: Bowel sounds are normal. There is no hepatosplenomegaly. There is tenderness (mild RLQ). There is no rigidity, no rebound, no guarding, no CVA tenderness and negative Murphy's sign. McBurney's point: very mild tenderness. No hernia. Hernia confirmed negative in the right inguinal area and confirmed negative in the left inguinal area.  Neurological: He is alert and oriented to person, place, and time.  Psychiatric: His behavior is  normal. Cognition and memory are normal.    Assessment/Plan 1. Right lower quadrant pain Very vague, long-standing (9-10 months), and subtle. Does not get severe and is not limiting any activity. He has had some bloodwork done through work physical that I have asked him to get (record release signed) I think reasonable to get a basic panel if not already completed during time of symptoms. He is in favor of limiting studies with radiation (discussed CT abd/pelvis if  needed down road) but is in agreement for getting and [place] of abdomen for further exploration of the RLQ. We discussed limitations of this study but will proceed for initial evaluation. He does agree to go to ER if pain suddenly becomes severe. - US Abdomen Complete; Future    Return pending [place] results and lab results.     Micheline Rough, MD

## 2018-02-14 ENCOUNTER — Encounter: Payer: Self-pay | Admitting: Family Medicine

## 2018-02-14 ENCOUNTER — Ambulatory Visit: Payer: 59 | Admitting: Family Medicine

## 2018-02-14 VITALS — BP 118/60 | HR 52 | Temp 98.1°F | Wt 169.0 lb

## 2018-02-14 DIAGNOSIS — R1031 Right lower quadrant pain: Secondary | ICD-10-CM

## 2018-02-22 ENCOUNTER — Telehealth: Payer: Self-pay | Admitting: Internal Medicine

## 2018-02-22 ENCOUNTER — Other Ambulatory Visit: Payer: 59

## 2018-02-22 NOTE — Telephone Encounter (Signed)
Copied from [place] [id number]. Topic: Quick Communication - See Telephone Encounter >> Feb 22, 2018  1:22 PM Bea Graff, NT wrote: CRM for notification. See Telephone encounter for: 02/22/18. Pt spoke with [facility] and they stated he will have to have his US done at the hospital and to contact his PCP for scheduling as they do not do [place] of appendix or bowel. Please advise.

## 2018-02-25 NOTE — Telephone Encounter (Signed)
[phone] Pt would like to have a call as soon as possible to get this scheduled

## 2018-02-26 ENCOUNTER — Telehealth: Payer: Self-pay | Admitting: Family Medicine

## 2018-02-26 NOTE — Telephone Encounter (Signed)
Pt need a new order placed per toni the scheduler at the hospital sent the Dr a message to place a new order

## 2018-02-26 NOTE — Telephone Encounter (Signed)
Pt need a  new order placed for  [id number] - US ABDOMEN COMPLETE need to be done at the hospital . The schedulers can not schedule off the old order from r [facility]

## 2018-02-27 ENCOUNTER — Other Ambulatory Visit: Payer: Self-pay | Admitting: Family Medicine

## 2018-02-27 DIAGNOSIS — R1031 Right lower quadrant pain: Secondary | ICD-10-CM

## 2018-02-27 NOTE — Progress Notes (Signed)
us

## 2018-02-27 NOTE — Telephone Encounter (Signed)
I put in new order but just to clarify; what hospital is he wanting done at? I selected [facility] but if this is not correct let me know as I will need to put in another order

## 2018-02-27 NOTE — Telephone Encounter (Signed)
Spoke with patient, [facility] is the [facility] hospital.

## 2018-03-04 ENCOUNTER — Other Ambulatory Visit: Payer: 59

## 2018-03-04 ENCOUNTER — Other Ambulatory Visit: Payer: Self-pay | Admitting: Internal Medicine

## 2018-03-04 ENCOUNTER — Ambulatory Visit
Admission: RE | Admit: 2018-03-04 | Discharge: 2018-03-04 | Disposition: A | Discharge status: Home / Self Care | Payer: 59 | Source: Ambulatory Visit | Attending: Family Medicine | Admitting: Family Medicine

## 2018-03-04 DIAGNOSIS — R109 Unspecified abdominal pain: Secondary | ICD-10-CM

## 2018-03-04 DIAGNOSIS — R1011 Right upper quadrant pain: Secondary | ICD-10-CM | POA: Diagnosis not present

## 2018-03-04 DIAGNOSIS — H16223 Keratoconjunctivitis sicca, not specified as Sjogren's, bilateral: Secondary | ICD-10-CM | POA: Diagnosis not present

## 2018-03-04 MED ORDER — IOPAMIDOL (ISOVUE-300) INJECTION 61%
100.0000 mL | Freq: Once | INTRAVENOUS | Status: AC | PRN
  Administered 2018-03-04: 100 mL via INTRAVENOUS

## 2018-03-04 NOTE — Telephone Encounter (Signed)
Clayton Frank received a call from [facility] stating that the imaging order Dr. Ethlyn Gallery had place will only view he Gallbladder and it needs to be changed to a CT in order to see the appendix area and bowel area. I spoke with patient per Dr. Crecencio Mc request and informed him that [facility] imaging does not think the imaging ordered will be helpful and the order has to be changed to CT with Contrast. Patient stated that his appointment was in 45 minutes and that he would contact [facility] to see if this could be done today or if he needed to reschedule.  Patient is aware that Order has been changed.

## 2018-03-04 NOTE — Telephone Encounter (Signed)
Noted  

## 2018-03-11 DIAGNOSIS — D225 Melanocytic nevi of trunk: Secondary | ICD-10-CM | POA: Diagnosis not present

## 2018-03-11 DIAGNOSIS — D1801 Hemangioma of skin and subcutaneous tissue: Secondary | ICD-10-CM | POA: Diagnosis not present

## 2018-03-11 DIAGNOSIS — L648 Other androgenic alopecia: Secondary | ICD-10-CM | POA: Diagnosis not present

## 2018-03-20 DIAGNOSIS — Z23 Encounter for immunization: Secondary | ICD-10-CM | POA: Diagnosis not present

## 2018-10-18 ENCOUNTER — Ambulatory Visit (INDEPENDENT_AMBULATORY_CARE_PROVIDER_SITE_OTHER): Payer: 59 | Admitting: Internal Medicine

## 2018-10-18 DIAGNOSIS — R1031 Right lower quadrant pain: Secondary | ICD-10-CM | POA: Diagnosis not present

## 2018-10-18 NOTE — Progress Notes (Signed)
Virtual Visit via Video Note  I connected with Clayton Frank on 10/18/18 at 11:30 AM EDT by a video enabled telemedicine application and verified that I am speaking with the correct person using two identifiers.  Location patient: home Location provider: work office Persons participating in the virtual visit: patient, provider  I discussed the limitations of evaluation and management by telemedicine and the availability of in person appointments. The patient expressed understanding and agreed to proceed.   HPI: He has scheduled this visit to discuss continued RLQ abdominal pain. He has been seen twice in the office last year for this. Has been ongoing since about August 2019. He had a normal RUQ [place] in 9/19 and a normal CT abd/pelvis in 11/19. No lab work that I can see. He describes low intensity pain in his RLQ that is more prominent after a core workout. No hernias that he can discern, no n/v, no diarrhea, no constipation, no blood in stool, no appetite change, no fatigue. Has a soft solid BM once or twice a day. He is concerned as his MGF and PGM both had colon cancer. He is requesting a GI referral.   ROS: Constitutional: Denies fever, chills, diaphoresis, appetite change and fatigue.  HEENT: Denies photophobia, eye pain, redness, hearing loss, ear pain, congestion, sore throat, rhinorrhea, sneezing, mouth sores, trouble swallowing, neck pain, neck stiffness and tinnitus.   Respiratory: Denies SOB, DOE, cough, chest tightness,  and wheezing.   Cardiovascular: Denies chest pain, palpitations and leg swelling.  Gastrointestinal: Denies nausea, vomiting,  diarrhea, constipation, blood in stool and abdominal distention.  Genitourinary: Denies dysuria, urgency, frequency, hematuria, flank pain and difficulty urinating.  Endocrine: Denies: hot or cold intolerance, sweats, changes in hair or nails, polyuria, polydipsia. Musculoskeletal: Denies myalgias, back pain, joint swelling,  arthralgias and gait problem.  Skin: Denies pallor, rash and wound.  Neurological: Denies dizziness, seizures, syncope, weakness, light-headedness, numbness and headaches.  Hematological: Denies adenopathy. Easy bruising, personal or family bleeding history  Psychiatric/Behavioral: Denies suicidal ideation, mood changes, confusion, nervousness, sleep disturbance and agitation   Past Medical History:  Diagnosis Date  . ADHD (attention deficit hyperactivity disorder)   . Concussion     No past surgical history on file.  Family History  Problem Relation Age of Onset  . Coronary artery disease Unknown        fhx  . Allergic rhinitis Father   . Hypertension Father   . Hyperlipidemia Father     SOCIAL HX:   reports that he has never smoked. He has never used smokeless tobacco. He reports that he does not drink alcohol or use drugs.  No current outpatient medications on file.  EXAM:   VITALS per patient if applicable: none reported  GENERAL: alert, oriented, appears well and in no acute distress  HEENT: atraumatic, conjunttiva clear, no obvious abnormalities on inspection of external nose and ears, wears corrective lenses  NECK: normal movements of the head and neck  LUNGS: on inspection no signs of respiratory distress, breathing rate appears normal, no obvious gross increased work of breathing, gasping or wheezing  CV: no obvious cyanosis  MS: moves all visible extremities without noticeable abnormality  PSYCH/NEURO: pleasant and cooperative, no obvious depression or anxiety, speech and thought processing grossly intact  ASSESSMENT AND PLAN:   RLQ abdominal pain  -Etiology remains unclear; hernia remains a possibility that I am unable to discard as this is a virtual visit. -Normal [place] and CT is very reassuring. -  Check CBC, CMET, UA. -Refer to GI per patient request.    I discussed the assessment and treatment plan with the patient. The patient was provided an  opportunity to ask questions and all were answered. The patient agreed with the plan and demonstrated an understanding of the instructions.   The patient was advised to call back or seek an in-person evaluation if the symptoms worsen or if the condition fails to improve as anticipated.    Lelon Frohlich, MD  [facility]

## 2018-11-05 ENCOUNTER — Telehealth: Payer: Self-pay | Admitting: *Deleted

## 2018-11-05 DIAGNOSIS — R1011 Right upper quadrant pain: Secondary | ICD-10-CM

## 2018-11-05 DIAGNOSIS — R1031 Right lower quadrant pain: Secondary | ICD-10-CM

## 2018-11-05 NOTE — Telephone Encounter (Signed)
Copied from [employer] [id number]. Topic: Referral - Request for Referral >> Oct 17, 2018  2:42 PM Richardo Priest, [place] wrote: Has patient seen PCP for this complaint? Yes.  *If NO, is insurance requiring patient see PCP for this issue before PCP can refer them? Referral for which specialty: Gastroenterology  Preferred provider/office: Any Reason for referral: Abdominal pain becoming more severe. States he has had this issue for 2 years. >> Nov 04, 2018  4:11 PM Cox, Melburn Hake, CMA wrote: Per OV notes referral was to be placed. There is no GI referral in chart. Please advise.  >> Nov 04, 2018  3:46 PM Rutherford Nail, NT wrote: Patient calling to check status of getting referral. Patient completed TOC with Dr Jerilee Hoh and has not heard anything from the office regarding the referral to GI. Please advise.  >> Oct 17, 2018  2:52 PM Cox, Melburn Hake, CMA wrote: Pt being scheduled for [facility] in order to have referral.

## 2018-11-05 NOTE — Telephone Encounter (Signed)
Referral placed per office note.

## 2018-11-06 NOTE — Telephone Encounter (Signed)
Left message on machine for patient to call back to schedule lab appointment and to see if he has a GI appointment.

## 2018-11-06 NOTE — Telephone Encounter (Signed)
Spoke with patient and he has schedule an appointment with GI and lab.

## 2018-11-06 NOTE — Telephone Encounter (Signed)
Pt returned call regarding lab appt. No answer on FC line x4. Please return call. Pt does not have GI appt yet

## 2018-11-19 ENCOUNTER — Other Ambulatory Visit (INDEPENDENT_AMBULATORY_CARE_PROVIDER_SITE_OTHER): Payer: 59

## 2018-11-19 DIAGNOSIS — R1031 Right lower quadrant pain: Secondary | ICD-10-CM

## 2018-11-19 LAB — COMPREHENSIVE METABOLIC PANEL
ALT: 16 U/L (ref 0–53)
AST: 12 U/L (ref 0–37)
Albumin: 4.7 g/dL (ref 3.5–5.2)
Alkaline Phosphatase: 67 U/L (ref 39–117)
BUN: 13 mg/dL (ref 6–23)
CO2: 28 mEq/L (ref 19–32)
Calcium: 9.4 mg/dL (ref 8.4–10.5)
Chloride: 103 mEq/L (ref 96–112)
Creatinine, Ser: 1.07 mg/dL (ref 0.40–1.50)
GFR: 84.68 mL/min (ref >60.00)
Glucose, Bld: 100 mg/dL — ABNORMAL HIGH (ref 70–99)
Potassium: 3.7 mEq/L (ref 3.5–5.1)
Sodium: 140 mEq/L (ref 135–145)
Total Bilirubin: 0.7 mg/dL (ref 0.2–1.2)
Total Protein: 6.9 g/dL (ref 6.0–8.3)

## 2018-11-19 LAB — CBC WITH DIFFERENTIAL/PLATELET
Basophils Absolute: 0.1 10*3/uL (ref 0.0–0.1)
Basophils Relative: 0.6 % (ref 0.0–3.0)
Eosinophils Absolute: 0.1 10*3/uL (ref 0.0–0.7)
Eosinophils Relative: 0.5 % (ref 0.0–5.0)
HCT: 46.1 % (ref 39.0–52.0)
Hemoglobin: 15.9 g/dL (ref 13.0–17.0)
Lymphocytes Relative: 16 % (ref 12.0–46.0)
Lymphs Abs: 2.4 10*3/uL (ref 0.7–4.0)
MCHC: 34.4 g/dL (ref 30.0–36.0)
MCV: 90.4 fl (ref 78.0–100.0)
Monocytes Absolute: 0.8 10*3/uL (ref 0.1–1.0)
Monocytes Relative: 5.3 % (ref 3.0–12.0)
Neutro Abs: 11.7 10*3/uL — ABNORMAL HIGH (ref 1.4–7.7)
Neutrophils Relative %: 77.6 % — ABNORMAL HIGH (ref 43.0–77.0)
Platelets: 230 10*3/uL (ref 150.0–400.0)
RBC: 5.1 Mil/uL (ref 4.22–5.81)
RDW: 12.4 % (ref 11.5–15.5)
WBC: 15.1 10*3/uL — ABNORMAL HIGH (ref 4.0–10.5)

## 2018-12-03 NOTE — Progress Notes (Signed)
TELEHEALTH VISIT  Referring Provider: Isaac Bliss, [facility]* Primary Care Physician:  Isaac Bliss, Rayford Halsted, MD   Tele-visit due to COVID-19 pandemic Patient requested visit virtually, consented to the virtual encounter via video enabled telemedicine application (Zoom) Contact made at: 09:15 12/04/18 Patient verified by name and date of birth Location of patient: Home Location provider: [facility] Names of persons participating: Me, patient, Tinnie Gens CMA Time spent on telehealth visit:  I discussed the limitations of evaluation and management by telemedicine. The patient expressed understanding and agreed to proceed.  Reason for Consultation:  Right lower quadrant pain   IMPRESSION:  Right lower quadrant pain x 18 months with negative Carnett's sign    - normal RUQ ultrasound 01/21/18    - normal CT abd/pelvis with contrast 03/04/18  Persistent focal right lower quadrant abdominal pain without alarm features.  CT scan in November showed a normal appendix and no abdominal wall hernias.  I recommended a colonoscopy for evaluation of the right colon and distal terminal ileum to exclude inflammatory bowel disease.  Must also consider chronic infections such as Giardia, Yersinia, tuberculosis, however he has no systemic complaints. This may ultimately turn out to be muscular wall pain due to cutaneous nerve root irritation.  PLAN: Colonoscopy  Please see the "Patient Instructions" section for addition details about the plan.  HPI: Clayton Frank is a 23 y.o. male referred by Dr. Isaac Bliss for further evaluation of right lower quadrant abdominal pain. The history is obtained through the patient and review of his electronic health record.   Intermittent, sharp, RLQ abdominal pain x 18 months. Localized to a golf ball size. Severe sharp pain last 10-15 seconds but it is more consistently a dull pain that lasts for hours. Occurs multiple times throughout the  day. Worse with core muscle exercises. No other identified triggers. Not worsened by touch.  No change with defecation or eating.  No nocturnal symptoms. No change in bowel habits. No diarrhea, constipation, blood in the stool. No blood or mucous in the stool. No other associated symptoms. No identified exacerbating or relieving features.  He is concerned that it may be muscular.   Prior abdominal imaging: Normal RUQ ultrasound 01/21/18. Non-diagnostic CT abd/pelvis with contrast 03/04/2018.  MGF (60s) and PGM (60s) with colon cancer. No other known family history of colon cancer or polyps. No family history of uterine/endometrial cancer, pancreatic cancer or gastric/stomach cancer.  Past Medical History:  Diagnosis Date  . ADHD (attention deficit hyperactivity disorder)   . Concussion     No past surgical history on file.  No current outpatient medications on file.   No current facility-administered medications for this visit.     Allergies as of 12/04/2018 - Review Complete 10/18/2018  Allergen Reaction Noted  . Bee venom Anaphylaxis 02/14/2018    Family History  Problem Relation Age of Onset  . Coronary artery disease Unknown        fhx  . Allergic rhinitis Father   . Hypertension Father   . Hyperlipidemia Father     Social History   Socioeconomic History  . Marital status: Single    Spouse name: Not on file  . Number of children: Not on file  . Years of education: Not on file  . Highest education level: Not on file  Occupational History  . Not on file  Social Needs  . Financial resource strain: Not on file  . Food insecurity    Worry: Not on file  Inability: Not on file  . Transportation needs    Medical: Not on file    Non-medical: Not on file  Tobacco Use  . Smoking status: Never Smoker  . Smokeless tobacco: Never Used  Substance and Sexual Activity  . Alcohol use: No  . Drug use: No  . Sexual activity: Not on file  Lifestyle  . Physical activity     Days per week: Not on file    Minutes per session: Not on file  . Stress: Not on file  Relationships  . Social Herbalist on phone: Not on file    Gets together: Not on file    Attends religious service: Not on file    Active member of club or organization: Not on file    Attends meetings of clubs or organizations: Not on file    Relationship status: Not on file  . Intimate partner violence    Fear of current or ex partner: Not on file    Emotionally abused: Not on file    Physically abused: Not on file    Forced sexual activity: Not on file  Other Topics Concern  . Not on file  Social History Narrative  . Not on file    Review of Systems: ALL ROS discussed and all others negative except listed in HPI. Extra GI manifestations of IBD are negative.   Physical Exam: Complete physical exam not performed due to the limits inherent in a telehealth encounter.  General: Awake, alert, and oriented, and well communicative. In no acute distress.  HEENT: EOMI, non-icteric sclera, NCAT, MMM  Neck: Normal movement of head and neck  Pulm: No labored breathing, speaking in full sentences without conversational dyspnea  ABD: Carnett's sign negative by head lift and leg lift Derm: No apparent lesions or bruising in visible field  MS: Moves all visible extremities without noticeable abnormality  Psych: Pleasant, cooperative, normal speech, normal affect and normal insight Neuro: Alert and appropriate   Uel Davidow L. Tarri Glenn, MD, MPH [facility] 12/03/2018, 9:14 PM

## 2018-12-04 ENCOUNTER — Encounter: Payer: Self-pay | Admitting: Gastroenterology

## 2018-12-04 ENCOUNTER — Ambulatory Visit (INDEPENDENT_AMBULATORY_CARE_PROVIDER_SITE_OTHER): Payer: 59 | Admitting: Gastroenterology

## 2018-12-04 VITALS — Ht 71.0 in | Wt 175.0 lb

## 2018-12-04 DIAGNOSIS — R1031 Right lower quadrant pain: Secondary | ICD-10-CM

## 2018-12-04 DIAGNOSIS — G8929 Other chronic pain: Secondary | ICD-10-CM | POA: Diagnosis not present

## 2018-12-04 MED ORDER — NA SULFATE-K SULFATE-MG SULF 17.5-3.13-1.6 GM/177ML PO SOLN: 1.0000 | ORAL | 0 refills | Status: DC

## 2018-12-04 NOTE — Patient Instructions (Signed)
I have recommended a colonoscopy for further evaluation.   Tips for colonoscopy:  - Stay well hydrated for 3-4 days prior to the exam. This reduces nausea and dehydration.  - To prevent skin/hemorrhoid irritation - prior to wiping, put A&Dointment or vaseline on the toilet paper. - Keep a towel or pad on the bed.  - Drink  64oz of clear liquids in the morning of prep day (prior to starting the prep) to be sure that there is enough fluid to flush the colon and stay hydrated!!!! This is in addition to the fluids required for preparation. - Use of a flavored hard candy, such as grape Anise Salvo, can counteract some of the flavor of the prep and may prevent some nausea.

## 2018-12-12 ENCOUNTER — Encounter: Payer: Self-pay | Admitting: Gastroenterology

## 2018-12-20 ENCOUNTER — Other Ambulatory Visit: Payer: Self-pay

## 2018-12-20 ENCOUNTER — Telehealth: Payer: Self-pay

## 2018-12-20 ENCOUNTER — Ambulatory Visit (AMBULATORY_SURGERY_CENTER): Payer: 59 | Admitting: Gastroenterology

## 2018-12-20 ENCOUNTER — Encounter: Payer: Self-pay | Admitting: Gastroenterology

## 2018-12-20 VITALS — BP 114/62 | HR 89 | Temp 99.3°F | Resp 15 | Ht 71.0 in | Wt 175.0 lb

## 2018-12-20 DIAGNOSIS — R1031 Right lower quadrant pain: Secondary | ICD-10-CM | POA: Diagnosis not present

## 2018-12-20 MED ORDER — SODIUM CHLORIDE 0.9 % IV SOLN: 500.0000 mL | Freq: Once | INTRAVENOUS | Status: DC

## 2018-12-20 NOTE — Op Note (Signed)
[facility] Patient Name: Clayton Frank Procedure Date: 12/20/2018 3:43 PM MRN: [id number] Endoscopist: Thornton Park MD, MD Age: 23 Referring MD:  Date of Birth: 1995-09-01 Gender: Male Account #: [id number] Procedure:                Colonoscopy Indications:              Right lower quadrant pain x 18 months with negative                            Carnett's sign                           - normal RUQ ultrasound 01/21/18                           - normal CT abd/pelvis with contrast 03/04/18 Medicines:                See the Anesthesia note for documentation of the                            administered medications Procedure:                Pre-Anesthesia Assessment:                           - Prior to the procedure, a History and Physical                            was performed, and patient medications and                            allergies were reviewed. The patient's tolerance of                            previous anesthesia was also reviewed. The risks                            and benefits of the procedure and the sedation                            options and risks were discussed with the patient.                            All questions were answered, and informed consent                            was obtained. Prior Anticoagulants: The patient has                            taken no previous anticoagulant or antiplatelet                            agents. ASA Grade Assessment: II - A patient with  mild systemic disease. After reviewing the risks                            and benefits, the patient was deemed in                            satisfactory condition to undergo the procedure.                           After obtaining informed consent, the colonoscope                            was passed under direct vision. Throughout the                            procedure, the patient's blood pressure, pulse, and           oxygen saturations were monitored continuously. The                            Colonoscope was introduced through the anus and                            advanced to the the terminal ileum, with                            identification of the appendiceal orifice and IC                            valve. A second forward view of the right colon was                            performed. The colonoscopy was performed without                            difficulty. The patient tolerated the procedure                            well. The quality of the bowel preparation was                            good. The terminal ileum, ileocecal valve,                            appendiceal orifice, and rectum were photographed. Scope In: 3:48:20 PM Scope Out: 4:01:55 PM Scope Withdrawal Time: 0 hours 11 minutes 1 second  Total Procedure Duration: 0 hours 13 minutes 35 seconds  Findings:                 The perianal and digital rectal examinations were                            normal.  The colon (entire examined portion) appeared normal.                           Approximately 15 cm of distal terminal ileum was                            examined. A localized area of mucosa in the distal                            ileum was nodular. Biopsies were taken with a cold                            forceps for histology. Estimated blood loss was                            minimal.                           The exam was otherwise without abnormality on                            direct and retroflexion views. Complications:            No immediate complications. Estimated blood loss:                            Minimal. Estimated Blood Loss:     Estimated blood loss was minimal. Impression:               - The entire examined colon is normal.                           - Confluent area of dodular ileal mucosa. This is                            of unclear clinical significance  and may be due to                            lymphoid follicles. Biopsied.                           - The examination was otherwise normal on direct                            and retroflexion views. Recommendation:           - Patient has a contact number available for                            emergencies. The signs and symptoms of potential                            delayed complications were discussed with the                            patient. Return  to normal activities tomorrow.                            Written discharge instructions were provided to the                            patient.                           - Resume previous diet today.                           - Continue present medications.                           - Await pathology results from the terminal ileum.                           - Repeat colonoscopy at age 87 for screening                            purposes. Thornton Park MD, MD 12/20/2018 4:08:37 PM This report has been signed electronically.

## 2018-12-20 NOTE — Progress Notes (Signed)
Report to PACU, RN, vss, BBS= Clear.  

## 2018-12-20 NOTE — Telephone Encounter (Signed)
Covid-19 screening questions   Do you now or have you had a fever in the last 14 days?  Do you have any respiratory symptoms of shortness of breath or cough now or in the last 14 days?  Do you have any family members or close contacts with diagnosed or suspected Covid-19 in the past 14 days?  Have you been tested for Covid-19 and found to be positive?    Left message to c/b   

## 2018-12-20 NOTE — Telephone Encounter (Signed)
Patient answered "NO" to all covid-19 questions °

## 2018-12-20 NOTE — Patient Instructions (Signed)
Read all handouts given to you by your recovery room nurse.  Thank-you for choosing us for your healthcare needs today.  YOU HAD AN ENDOSCOPIC PROCEDURE TODAY AT THE [facility]:   Refer to the procedure report that was given to you for any specific questions about what was found during the examination.  If the procedure report does not answer your questions, please call your gastroenterologist to clarify.  If you requested that your care partner not be given the details of your procedure findings, then the procedure report has been included in a sealed envelope for you to review at your convenience later.  YOU SHOULD EXPECT: Some feelings of bloating in the abdomen. Passage of more gas than usual.  Walking can help get rid of the air that was put into your GI tract during the procedure and reduce the bloating. If you had a lower endoscopy (such as a colonoscopy or flexible sigmoidoscopy) you may notice spotting of blood in your stool or on the toilet paper. If you underwent a bowel prep for your procedure, you may not have a normal bowel movement for a few days.  Please Note:  You might notice some irritation and congestion in your nose or some drainage.  This is from the oxygen used during your procedure.  There is no need for concern and it should clear up in a day or so.  SYMPTOMS TO REPORT IMMEDIATELY:   Following lower endoscopy (colonoscopy or flexible sigmoidoscopy):  Excessive amounts of blood in the stool  Significant tenderness or worsening of abdominal pains  Swelling of the abdomen that is new, acute  Fever of 100F or higher   For urgent or emergent issues, a gastroenterologist can be reached at any hour by calling [phone].   DIET:  We do recommend a small meal at first, but then you may proceed to your regular diet.  Drink plenty of fluids but you should avoid alcoholic beverages for 24 hours.  ACTIVITY:  You should plan to take it easy for the rest of today  and you should NOT DRIVE or use heavy machinery until tomorrow (because of the sedation medicines used during the test).    FOLLOW UP: Our staff will call the number listed on your records 48-72 hours following your procedure to check on you and address any questions or concerns that you may have regarding the information given to you following your procedure. If we do not reach you, we will leave a message.  We will attempt to reach you two times.  During this call, we will ask if you have developed any symptoms of COVID 19. If you develop any symptoms (ie: fever, flu-like symptoms, shortness of breath, cough etc.) before then, please call [phone].  If you test positive for Covid 19 in the 2 weeks post procedure, please call and report this information to us.    If any biopsies were taken you will be contacted by phone or by letter within the next 1-3 weeks.  Please call us at [phone] if you have not heard about the biopsies in 3 weeks.    SIGNATURES/CONFIDENTIALITY: You and/or your care partner have signed paperwork which will be entered into your electronic medical record.  These signatures attest to the fact that that the information above on your After Visit Summary has been reviewed and is understood.  Full responsibility of the confidentiality of this discharge information lies with you and/or your care-partner. 

## 2018-12-23 ENCOUNTER — Telehealth: Payer: Self-pay | Admitting: Gastroenterology

## 2018-12-23 NOTE — Telephone Encounter (Signed)
Pt inquired about pathology results.  

## 2018-12-23 NOTE — Telephone Encounter (Signed)
Pt requested a call back at 4:15 PM.

## 2018-12-23 NOTE — Telephone Encounter (Signed)
Spoke to the patient, told the patient surgical path results are not back yet and he would either receive a phone call or a letter alerting him of the results.

## 2018-12-24 ENCOUNTER — Telehealth: Payer: Self-pay | Admitting: *Deleted

## 2018-12-24 NOTE — Telephone Encounter (Signed)
  Follow up Call-  Call back number 12/20/2018  Post procedure Call Back phone  # [phone]  Permission to leave phone message Yes  Some recent data might be hidden     Patient questions:  Do you have a fever, pain , or abdominal swelling? No. Pain Score  0 *  Have you tolerated food without any problems? Yes.    Have you been able to return to your normal activities? Yes.    Do you have any questions about your discharge instructions: Diet   Yes.   Medications  Yes.   Follow up visit  Yes.    Do you have questions or concerns about your Care? No.  Actions: * If pain score is 4 or above: No action needed, pain <4.  1. Have you developed a fever since your procedure? NO  2.   Have you had an respiratory symptoms (SOB or cough) since your procedure? NO  3.   Have you tested positive for COVID 19 since your procedure NO  4.   Have you had any family members/close contacts diagnosed with the COVID 19 since your procedure?  NO   If yes to any of these questions please route to Joylene John, RN and Alphonsa Gin, RN.

## 2018-12-26 ENCOUNTER — Other Ambulatory Visit: Payer: Self-pay | Admitting: *Deleted

## 2018-12-26 MED ORDER — DICYCLOMINE HCL 20 MG PO TABS: 20.0000 mg | ORAL_TABLET | Freq: Four times a day (QID) | ORAL | 1 refills | Status: AC | PRN

## 2019-01-29 ENCOUNTER — Ambulatory Visit: Payer: 59 | Admitting: Gastroenterology

## 2019-06-20 ENCOUNTER — Ambulatory Visit: Payer: 59 | Attending: Internal Medicine

## 2019-06-20 DIAGNOSIS — Z23 Encounter for immunization: Secondary | ICD-10-CM | POA: Insufficient documentation

## 2019-06-20 NOTE — Progress Notes (Signed)
   Covid-19 Vaccination Clinic  Name:  Clayton Frank    MRN: [id number] DOB: 09/27/1995  06/20/2019  Clayton Frank was observed post Covid-19 immunization for 15 minutes without incidence. He was provided with Vaccine Information Sheet and instruction to access the V-Safe system.   Clayton Frank was instructed to call 911 with any severe reactions post vaccine: Marland Kitchen Difficulty breathing  . Swelling of your face and throat  . A fast heartbeat  . A bad rash all over your body  . Dizziness and weakness    Immunizations Administered    Name Date Dose VIS Date Route   Pfizer COVID-19 Vaccine 06/20/2019  4:30 PM 0.3 mL 04/11/2019 Intramuscular   Manufacturer: [employer]   Lot: [id number]   [place]: [id number]

## 2019-07-01 ENCOUNTER — Telehealth: Payer: Self-pay | Admitting: Internal Medicine

## 2019-07-01 NOTE — Telephone Encounter (Signed)
Pt was told by his gf gynecologist that he needs to be tested for Ureaplasma. He is wondering if that is something we can test at our location.   Pt can be reached at [phone] per pt to leave a detailed message at this number

## 2019-07-01 NOTE — Telephone Encounter (Signed)
Spoke with patient and it is a test that can be done at our office.  Patient will call back to schedule.

## 2019-07-01 NOTE — Telephone Encounter (Signed)
Patient is scheduled with Dr. Elease Hashimoto 07/08/2019 at 1:45

## 2019-07-08 ENCOUNTER — Ambulatory Visit: Payer: 59 | Admitting: Family Medicine

## 2019-07-15 ENCOUNTER — Ambulatory Visit: Payer: 59 | Attending: Internal Medicine

## 2019-07-15 DIAGNOSIS — Z23 Encounter for immunization: Secondary | ICD-10-CM

## 2019-07-15 NOTE — Progress Notes (Signed)
   Covid-19 Vaccination Clinic  Name:  KUPONO GOROSTIETA    MRN: [id number] DOB: 1996-04-03  07/15/2019  Mr. Henneman was observed post Covid-19 immunization for 30 minutes based on pre-vaccination screening without incident. He was provided with Vaccine Information Sheet and instruction to access the V-Safe system.   Mr. Meece was instructed to call 911 with any severe reactions post vaccine: Marland Kitchen Difficulty breathing  . Swelling of face and throat  . A fast heartbeat  . A bad rash all over body  . Dizziness and weakness   Immunizations Administered    Name Date Dose VIS Date Route   Pfizer COVID-19 Vaccine 07/15/2019  4:35 PM 0.3 mL 04/11/2019 Intramuscular   Manufacturer: [employer]   Lot: [id number]   [place]: [id number]

## 2019-07-22 ENCOUNTER — Other Ambulatory Visit: Payer: Self-pay

## 2019-07-23 ENCOUNTER — Encounter: Payer: Self-pay | Admitting: Family Medicine

## 2019-07-23 ENCOUNTER — Ambulatory Visit (INDEPENDENT_AMBULATORY_CARE_PROVIDER_SITE_OTHER): Payer: 59 | Admitting: Family Medicine

## 2019-07-23 VITALS — BP 122/68 | HR 82 | Temp 97.8°F | Wt 163.4 lb

## 2019-07-23 DIAGNOSIS — Z113 Encounter for screening for infections with a predominantly sexual mode of transmission: Secondary | ICD-10-CM | POA: Diagnosis not present

## 2019-07-23 MED ORDER — EPINEPHRINE 0.3 MG/0.3ML IJ SOAJ: 0.3000 mg | INTRAMUSCULAR | 1 refills | Status: DC | PRN

## 2019-07-23 NOTE — Progress Notes (Signed)
  Subjective:     Patient ID: Clayton Frank, male   DOB: 1995/12/26, 24 y.o.   MRN: [id number]  HPI   Clayton Frank is here specifically requesting testing/screening for Ureaplasma.  He states his fiance was diagnosed with this by her GYN over a month ago.  Clayton Frank recalls being treated with some type of antibiotic about a month ago.  He has had no symptoms whatsoever.  No penile discharge.  No dysuria.  No fevers or chills.  No known history of STD.  Apparently, his fiance has had recurrent Ureaplasma and they are trying to rule out why she keeps getting this.  Past Medical History:  Diagnosis Date  . ADHD (attention deficit hyperactivity disorder)   . Concussion    Past Surgical History:  Procedure Laterality Date  . WISDOM TOOTH EXTRACTION      reports that he has never smoked. He has never used smokeless tobacco. He reports that he does not drink alcohol or use drugs. family history includes Allergic rhinitis in his father; Colon cancer in his maternal grandfather and paternal grandmother; Coronary artery disease in an other family member; Hyperlipidemia in his father; Hypertension in his father. Allergies  Allergen Reactions  . Bee Venom Anaphylaxis     Review of Systems  Constitutional: Negative for chills and fever.  Genitourinary: Negative for discharge, dysuria, genital sores, hematuria and testicular pain.       Objective:   Physical Exam Vitals reviewed.  Constitutional:      Appearance: Normal appearance.  Cardiovascular:     Rate and Rhythm: Normal rate and regular rhythm.  Neurological:     Mental Status: He is alert.        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  Patient denies any urethritis type symptoms and he reportedly was treated empirically 1 month ago with some type of antibiotic- presumably doxycycline.  Request today is for testing for Ureaplasma specifically    Plan:     -We will send urine for Ureaplasma  testing -He and his fiance are remaining abstinent until further clarified  Eulas Post MD [facility]

## 2019-07-31 LAB — MYCOPLASMA / UREAPLASMA CULTURE

## 2019-07-31 LAB — TIQ-NTM

## 2019-08-04 ENCOUNTER — Other Ambulatory Visit: Payer: Self-pay

## 2019-08-04 ENCOUNTER — Telehealth: Payer: Self-pay | Admitting: Internal Medicine

## 2019-08-04 MED ORDER — DOXYCYCLINE HYCLATE 100 MG PO TABS: 100.0000 mg | ORAL_TABLET | Freq: Two times a day (BID) | ORAL | 0 refills | Status: DC

## 2019-08-04 NOTE — Telephone Encounter (Signed)
See result note.  

## 2019-08-04 NOTE — Telephone Encounter (Signed)
Pt returned call for results and would like a call back.

## 2019-08-18 ENCOUNTER — Other Ambulatory Visit: Payer: Self-pay

## 2019-08-19 ENCOUNTER — Ambulatory Visit (INDEPENDENT_AMBULATORY_CARE_PROVIDER_SITE_OTHER): Payer: 59 | Admitting: Family Medicine

## 2019-08-19 ENCOUNTER — Encounter: Payer: Self-pay | Admitting: Family Medicine

## 2019-08-19 VITALS — BP 120/70 | HR 85 | Temp 98.2°F | Wt 171.7 lb

## 2019-08-19 DIAGNOSIS — Z113 Encounter for screening for infections with a predominantly sexual mode of transmission: Secondary | ICD-10-CM | POA: Diagnosis not present

## 2019-08-19 NOTE — Addendum Note (Signed)
Addended by: Elmer Picker on: 08/19/2019 10:45 AM   Modules accepted: Orders

## 2019-08-19 NOTE — Progress Notes (Signed)
  Subjective:     Patient ID: Clayton Frank, male   DOB: [birth year], 24 y.o.   MRN: [id number]  HPI Clayton Frank is seen requesting repeat test for Ureaplasma infection.  His significant other was diagnosed with this several weeks ago.  She reportedly was treated with doxycycline but may have had some resistance issues and had to be treated eventually with a flora quinolone antibiotic.  Clayton Frank is been totally asymptomatic all along.  No dysuria.  No fever.  No penile discharge.  No history of STD.  He did have positive Ureaplasma him screen recently and was treated with doxycycline 100 mg twice daily for 10 days and finished the antibiotic.  He simply wants to make sure today he does not have persistent infection  Past Medical History:  Diagnosis Date  . ADHD (attention deficit hyperactivity disorder)   . Concussion    Past Surgical History:  Procedure Laterality Date  . WISDOM TOOTH EXTRACTION      reports that he has never smoked. He has never used smokeless tobacco. He reports that he does not drink alcohol or use drugs. family history includes Allergic rhinitis in his father; Colon cancer in his maternal grandfather and paternal grandmother; Coronary artery disease in an other family member; Hyperlipidemia in his father; Hypertension in his father. Allergies  Allergen Reactions  . Bee Venom Anaphylaxis     Review of Systems  Constitutional: Negative for chills and fever.  Genitourinary: Negative for discharge, dysuria and genital sores.  Skin: Negative for rash.       Objective:   Physical Exam Vitals reviewed.  Constitutional:      Appearance: Normal appearance.  Cardiovascular:     Rate and Rhythm: Normal rate and regular rhythm.  Neurological:     Mental Status: He is alert.        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  Patient requesting test of cure    Plan:     -Recheck Ureaplasma/mycoplasma culture.  If positive this time  will consider fluoroquinolone use since he has already been treated with full course of doxycycline  Eulas Post MD [facility]

## 2019-08-25 ENCOUNTER — Telehealth: Payer: Self-pay | Admitting: Internal Medicine

## 2019-08-25 NOTE — Telephone Encounter (Signed)
Robyn from [facility] stated she is needing the source for the Micro/Urea culture for this pt.   Bailey Mech can be reached at [phone]  Please reference this code: [id number]

## 2019-08-25 NOTE — Telephone Encounter (Signed)
Pt has been notified that results have not came back yet

## 2019-08-25 NOTE — Telephone Encounter (Signed)
Pt was calling about his test results from 4/20. He saw Dr. Elease Hashimoto for this particular lab test.    Pt can be reached at [phone]

## 2019-08-25 NOTE — Telephone Encounter (Signed)
Source has been given

## 2019-08-26 LAB — MYCOPLASMA / UREAPLASMA CULTURE

## 2019-08-26 LAB — TIQ-NTM

## 2019-10-01 ENCOUNTER — Other Ambulatory Visit: Payer: Self-pay

## 2019-10-01 ENCOUNTER — Emergency Department (HOSPITAL_COMMUNITY)
Admission: EM | Admit: 2019-10-01 | Discharge: 2019-10-01 | Disposition: A | Discharge status: Home / Self Care | Payer: No Typology Code available for payment source | Attending: Emergency Medicine | Admitting: Emergency Medicine

## 2019-10-01 ENCOUNTER — Encounter (HOSPITAL_COMMUNITY): Payer: Self-pay

## 2019-10-01 DIAGNOSIS — R6889 Other general symptoms and signs: Secondary | ICD-10-CM | POA: Insufficient documentation

## 2019-10-01 DIAGNOSIS — Z5321 Procedure and treatment not carried out due to patient leaving prior to being seen by health care provider: Secondary | ICD-10-CM | POA: Diagnosis not present
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
## 2019-11-20 ENCOUNTER — Telehealth: Payer: Self-pay | Admitting: Internal Medicine

## 2019-11-20 MED ORDER — EPINEPHRINE 0.3 MG/0.3ML IJ SOAJ: 0.3000 mg | INTRAMUSCULAR | 1 refills | Status: AC | PRN

## 2019-11-20 NOTE — Telephone Encounter (Signed)
Pt call and need the epic pen sent to [address],[place],[id number]

## 2019-11-20 NOTE — Telephone Encounter (Signed)
Rx sent 

## 2020-05-07 ENCOUNTER — Ambulatory Visit: Payer: Self-pay | Admitting: Family Medicine

## 2020-08-23 IMAGING — CT CT ABD-PELV W/ CM
2 of 4 series · 13 of 46 positions shown, 15 images · IV contrast (iopamidol)
Comparison: Right upper quadrant ultrasound dated January 21, 2018.

CLINICAL DATA: Right upper quadrant pain for the past 8-9 months.

EXAM:
CT ABDOMEN AND PELVIS WITH CONTRAST
TECHNIQUE: Multidetector CT imaging of the abdomen and pelvis was performed
using the standard protocol following bolus administration of
intravenous contrast.
CONTRAST:  100mL A1DXIT-DEE IOPAMIDOL (A1DXIT-DEE) INJECTION 61%

[Series 2: abd pelvis 5.00 br40 s3 ax · axial · 0.59mm/px · z∈[+1277,+1667]mm · 10 of 94 slices shown, 12 images]
[im 8/94  soft-tissue]
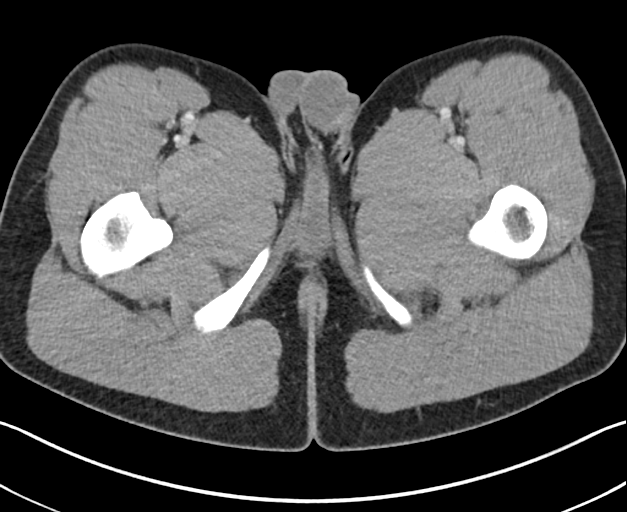
[im 8/94  bone]
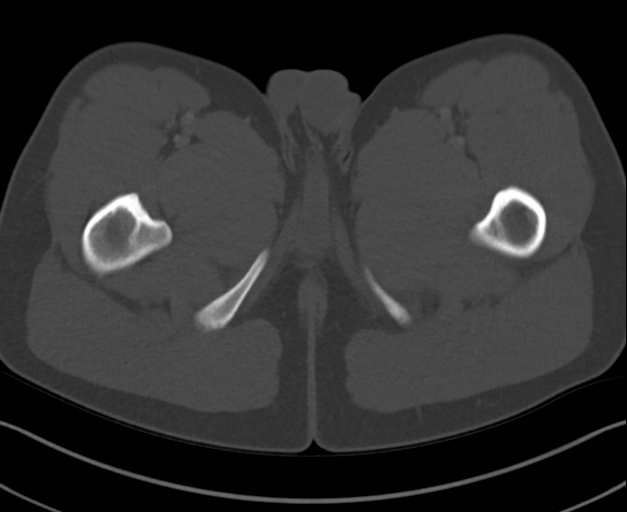
[im 16/94  soft-tissue]
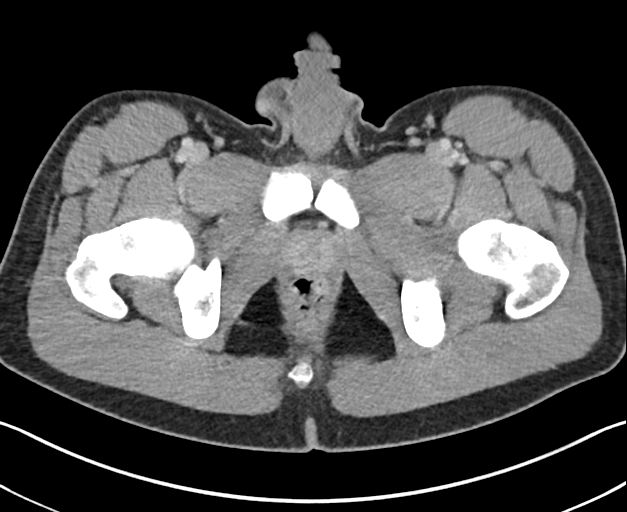
[im 24/94  soft-tissue]
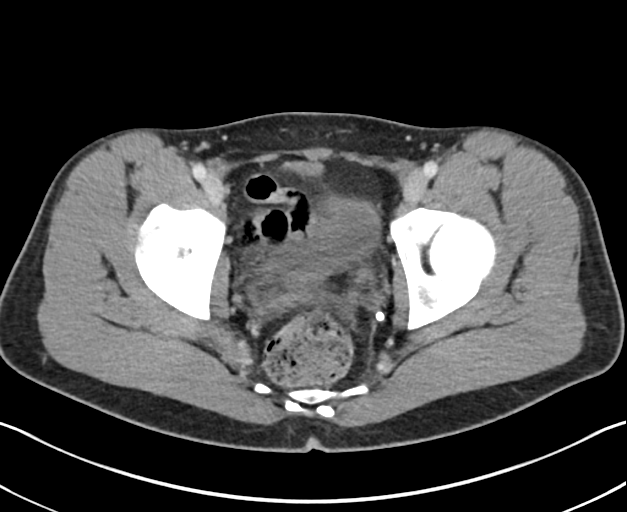
[im 35/94  soft-tissue]
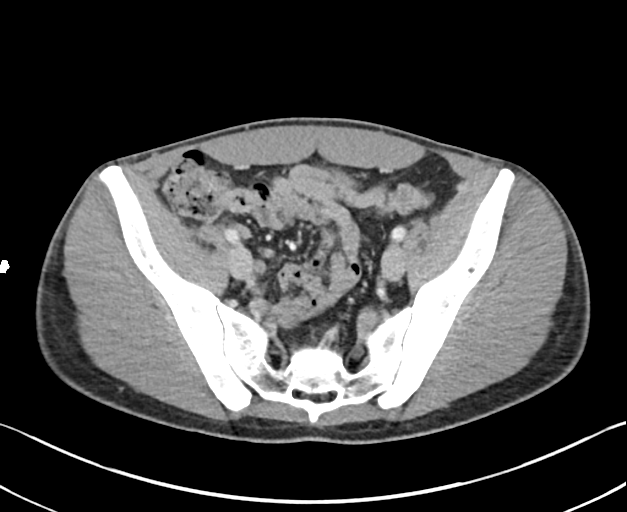
[im 43/94  soft-tissue]
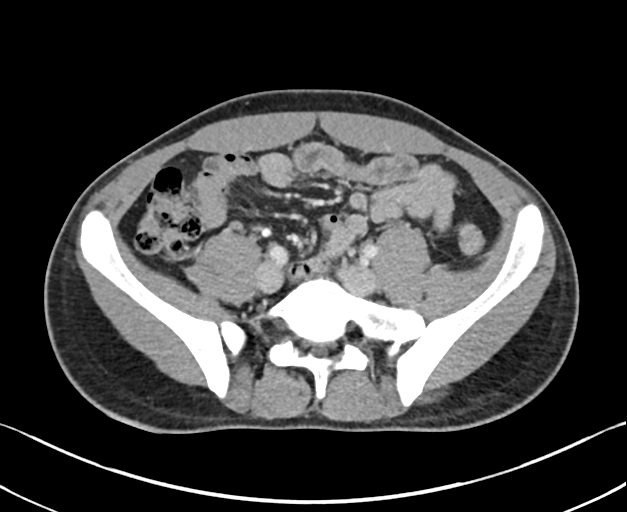
[im 51/94  soft-tissue]
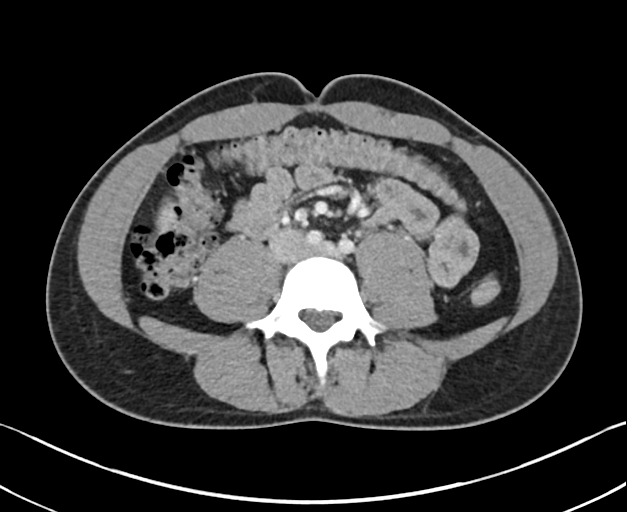
[im 59/94  soft-tissue]
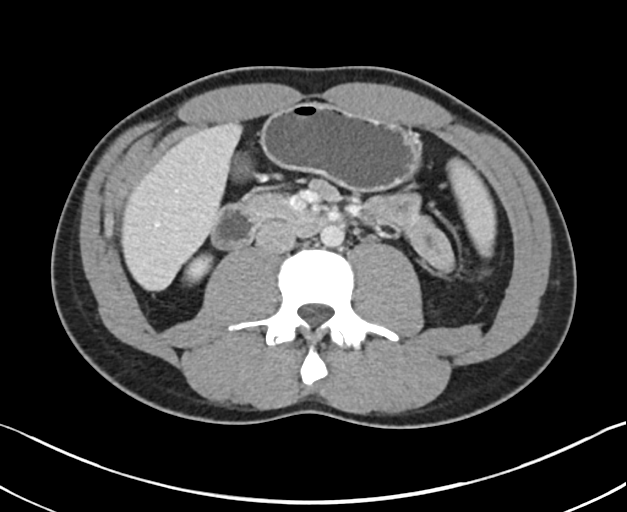
[im 70/94  soft-tissue]
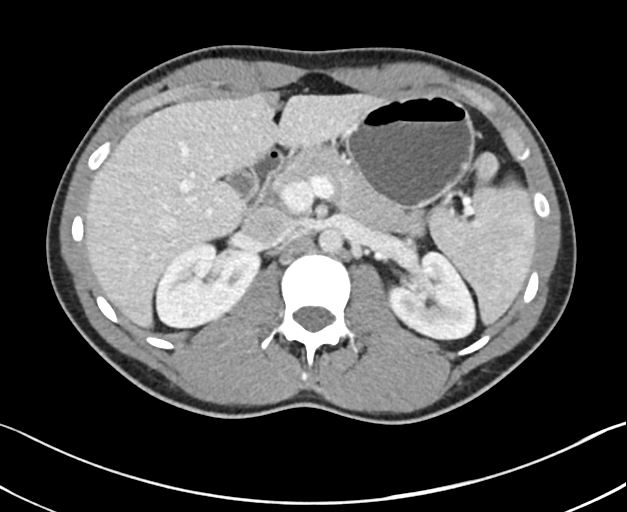
[im 78/94  soft-tissue]
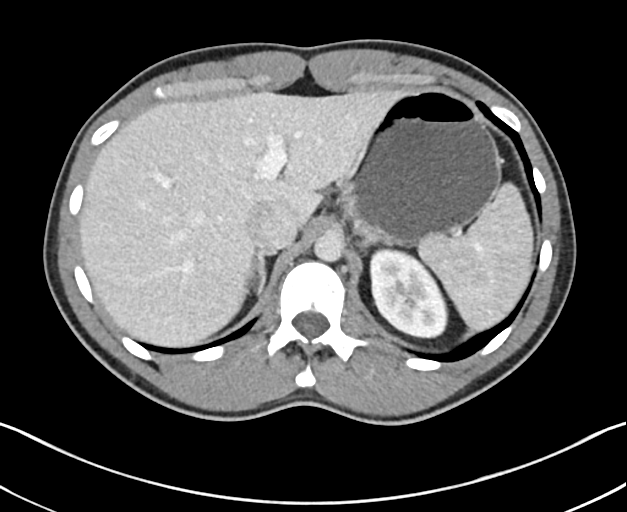
[im 78/94  bone]
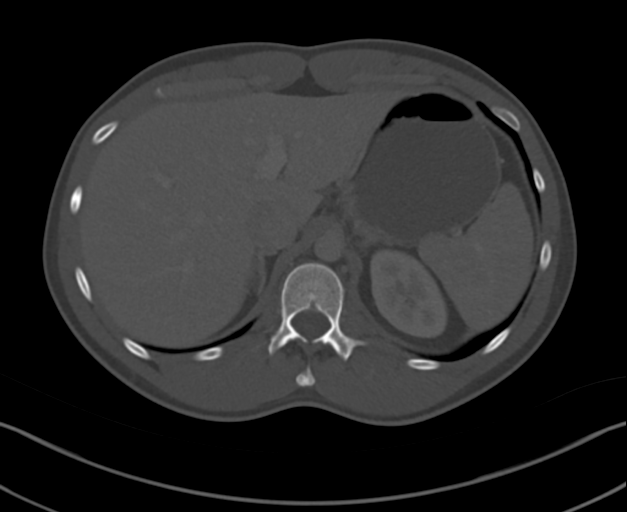
[im 86/94  soft-tissue]
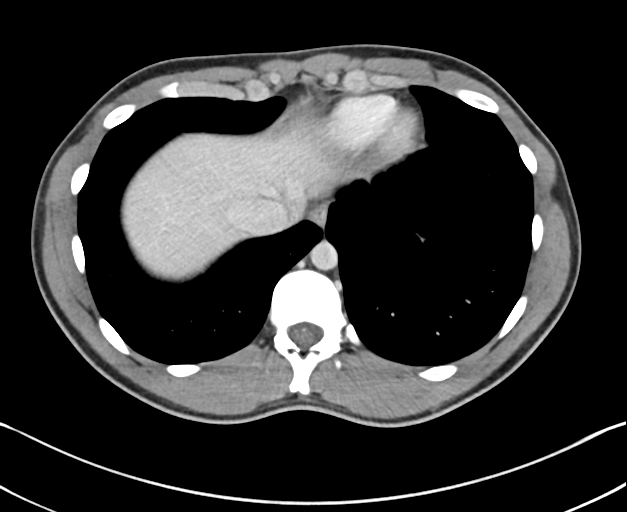

[Series 6: abd pelvis 2.00 br40 s3 cor · coronal · 0.72mm/px · 3 of 136 slices shown]
[im 46/136  soft-tissue]
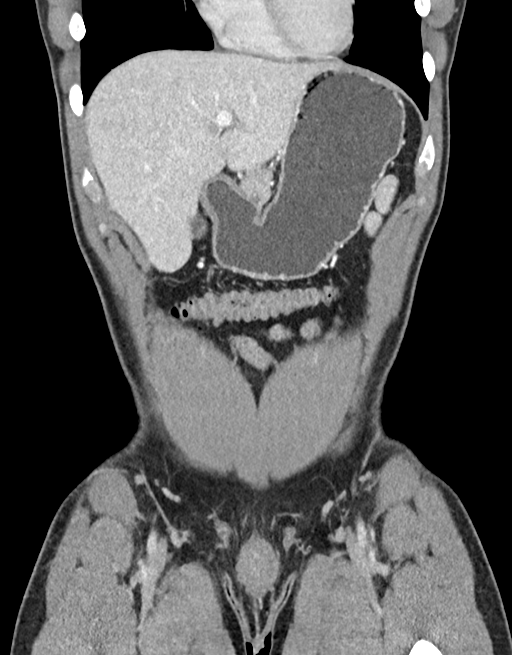
[im 61/136  soft-tissue]
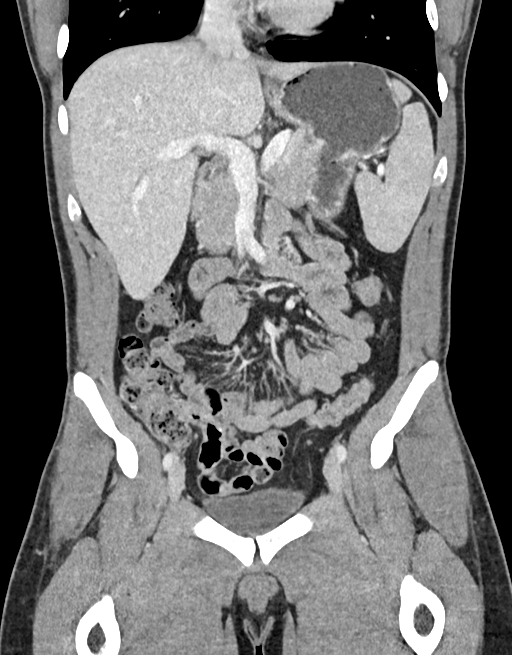
[im 76/136  soft-tissue]
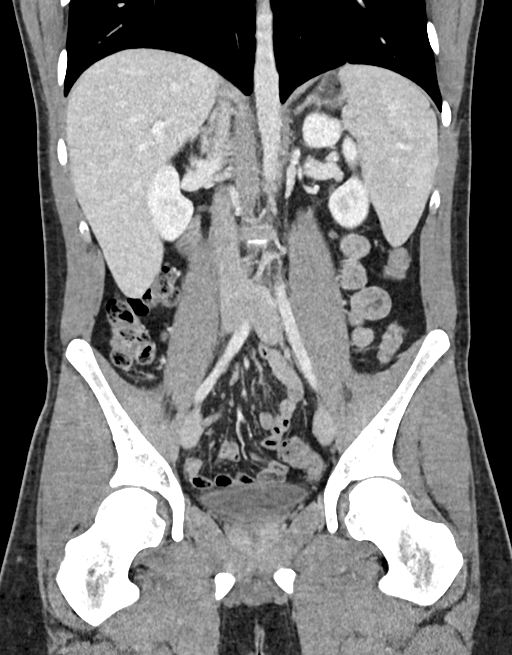

[13 of 46 positions shown; findings below may reference images not displayed]

FINDINGS: Lower chest: No acute abnormality.

Hepatobiliary: Focal fat along the falciform ligament. No other
focal liver abnormality. The gallbladder is unremarkable. No biliary
dilatation.

Pancreas: Unremarkable. No pancreatic ductal dilatation or
surrounding inflammatory changes.

Spleen: Normal in size without focal abnormality.

Adrenals/Urinary Tract: Adrenal glands are unremarkable. Kidneys are
normal, without renal calculi, focal lesion, or hydronephrosis.
Bladder is unremarkable.

Stomach/Bowel: Stomach is within normal limits. Appendix appears
normal. No evidence of bowel wall thickening, distention, or
inflammatory changes.

Vascular/Lymphatic: No significant vascular findings are present. No
enlarged abdominal or pelvic lymph nodes.

Reproductive: Prostate is unremarkable.

Other: No abdominal wall hernia or abnormality. No abdominopelvic
ascites. No pneumoperitoneum.

Musculoskeletal: No acute or significant osseous findings.
Transitional lumbosacral anatomy with partial sacralization of L5 on
the left. There are few scattered bone islands in the pelvis and
proximal femurs.
IMPRESSION: 1. Unremarkable CT of the abdomen and pelvis. No explanation for the
patient's symptoms.

## 2020-08-23 NOTE — Progress Notes (Signed)
Error, please disregard.

## 2020-08-23 NOTE — Telephone Encounter (Signed)
Error, please disregard.
